# Patient Record
Sex: Female | Born: 1985 | Race: Black or African American | Hispanic: No | Marital: Single | Smoking: Light tobacco smoker
Health system: Southern US, Community
[De-identification: ages and names within clinical notes are randomized; demographics above are authoritative.]

## PROBLEM LIST (undated history)

## (undated) DIAGNOSIS — E079 Disorder of thyroid, unspecified: Secondary | ICD-10-CM

## (undated) HISTORY — PX: BREAST REDUCTION SURGERY: SHX8

---

## 2004-10-16 ENCOUNTER — Emergency Department (HOSPITAL_COMMUNITY): Admission: EM | Admit: 2004-10-16 | Discharge: 2004-10-16 | Payer: Self-pay | Admitting: Family Medicine

## 2005-09-25 ENCOUNTER — Emergency Department (HOSPITAL_COMMUNITY): Admission: EM | Admit: 2005-09-25 | Discharge: 2005-09-26 | Payer: Self-pay | Admitting: Emergency Medicine

## 2006-04-02 ENCOUNTER — Emergency Department (HOSPITAL_COMMUNITY): Admission: EM | Admit: 2006-04-02 | Discharge: 2006-04-02 | Payer: Self-pay | Admitting: Emergency Medicine

## 2006-04-23 ENCOUNTER — Emergency Department (HOSPITAL_COMMUNITY): Admission: EM | Admit: 2006-04-23 | Discharge: 2006-04-23 | Payer: Self-pay | Admitting: Emergency Medicine

## 2006-08-23 ENCOUNTER — Emergency Department (HOSPITAL_COMMUNITY): Admission: EM | Admit: 2006-08-23 | Discharge: 2006-08-23 | Payer: Self-pay | Admitting: Family Medicine

## 2006-10-17 ENCOUNTER — Emergency Department (HOSPITAL_COMMUNITY): Admission: EM | Admit: 2006-10-17 | Discharge: 2006-10-18 | Payer: Self-pay | Admitting: Emergency Medicine

## 2006-11-13 ENCOUNTER — Emergency Department (HOSPITAL_COMMUNITY): Admission: EM | Admit: 2006-11-13 | Discharge: 2006-11-13 | Payer: Self-pay | Admitting: Emergency Medicine

## 2007-01-14 ENCOUNTER — Emergency Department (HOSPITAL_COMMUNITY): Admission: EM | Admit: 2007-01-14 | Discharge: 2007-01-14 | Payer: Self-pay | Admitting: Emergency Medicine

## 2007-04-15 ENCOUNTER — Inpatient Hospital Stay (HOSPITAL_COMMUNITY): Admission: AD | Admit: 2007-04-15 | Discharge: 2007-04-15 | Payer: Self-pay | Admitting: Obstetrics and Gynecology

## 2008-11-20 ENCOUNTER — Other Ambulatory Visit (HOSPITAL_COMMUNITY): Admission: RE | Admit: 2008-11-20 | Discharge: 2009-02-18 | Payer: Self-pay | Admitting: Psychiatry

## 2008-11-22 ENCOUNTER — Ambulatory Visit: Payer: Self-pay | Admitting: Psychiatry

## 2008-12-09 ENCOUNTER — Ambulatory Visit (HOSPITAL_COMMUNITY): Payer: Self-pay | Admitting: Marriage and Family Therapist

## 2011-04-07 LAB — CBC
Hemoglobin: 13.4
Platelets: 236
RBC: 4.3
WBC: 12.8 — ABNORMAL HIGH

## 2011-04-07 LAB — URINALYSIS, ROUTINE W REFLEX MICROSCOPIC
Bilirubin Urine: NEGATIVE
Glucose, UA: NEGATIVE
Protein, ur: NEGATIVE
Specific Gravity, Urine: >1.03 — ABNORMAL HIGH
pH: 6

## 2011-04-12 LAB — POCT I-STAT CREATININE
Creatinine, Ser: 0.7
Operator id: 284251

## 2011-04-12 LAB — I-STAT 8, (EC8 V) (CONVERTED LAB)
Acid-base deficit: 3 — ABNORMAL HIGH
BUN: 10
Glucose, Bld: 108 — ABNORMAL HIGH
Hemoglobin: 12.9
Sodium: 137

## 2011-04-12 LAB — HCG, QUANTITATIVE, PREGNANCY: hCG, Beta Chain, Quant, S: 33033 — ABNORMAL HIGH

## 2011-04-12 LAB — ABO/RH: ABO/RH(D): O POS

## 2014-11-30 ENCOUNTER — Encounter (HOSPITAL_BASED_OUTPATIENT_CLINIC_OR_DEPARTMENT_OTHER): Payer: Self-pay | Admitting: *Deleted

## 2014-11-30 ENCOUNTER — Emergency Department (HOSPITAL_BASED_OUTPATIENT_CLINIC_OR_DEPARTMENT_OTHER)
Admission: EM | Admit: 2014-11-30 | Discharge: 2014-11-30 | Disposition: A | Discharge status: Home / Self Care | Payer: Medicaid Other | Attending: Emergency Medicine | Admitting: Emergency Medicine

## 2014-11-30 DIAGNOSIS — B86 Scabies: Secondary | ICD-10-CM | POA: Insufficient documentation

## 2014-11-30 DIAGNOSIS — E079 Disorder of thyroid, unspecified: Secondary | ICD-10-CM | POA: Insufficient documentation

## 2014-11-30 DIAGNOSIS — Z79899 Other long term (current) drug therapy: Secondary | ICD-10-CM | POA: Insufficient documentation

## 2014-11-30 DIAGNOSIS — R21 Rash and other nonspecific skin eruption: Secondary | ICD-10-CM | POA: Diagnosis present

## 2014-11-30 HISTORY — DX: Disorder of thyroid, unspecified: E07.9

## 2014-11-30 MED ORDER — PERMETHRIN 5 % EX CREA: TOPICAL_CREAM | CUTANEOUS | Status: DC

## 2014-11-30 NOTE — ED Notes (Signed)
Increasing itching rash all over body (daughter has same).  Itching worst between legs, back and in places that stay warm (bra line).

## 2014-11-30 NOTE — Discharge Instructions (Signed)

## 2014-11-30 NOTE — ED Provider Notes (Signed)
CSN: [id number]     Arrival date & time 11/30/14  1148 History   First MD Initiated Contact with Patient 11/30/14 1210     Chief Complaint  Patient presents with  . Rash     (Consider location/radiation/quality/duration/timing/severity/associated sxs/prior Treatment) HPI Comments: Patient presents to the ER for evaluation of itchy rash. She reports that the rash began after traveling to [place] and staying in a hotel. Rash has progressively worsened over the last month. Patient reports that the rash is extremely itchy. No tongue swelling, throat swelling or difficulty breathing. No new skin preparations, detergents, etc. Patient reports that her daughter has a rash.  Patient is a 29 y.o. female presenting with rash.  Rash   Past Medical History  Diagnosis Date  . Thyroid disease    History reviewed. No pertinent past surgical history. No family history on file. History  Substance Use Topics  . Smoking status: Never Smoker   . Smokeless tobacco: Not on file  . Alcohol Use: Not on file   OB History    No data available     Review of Systems  Skin: Positive for rash.  All other systems reviewed and are negative.     Allergies  Review of patient's allergies indicates no known allergies.  Home Medications   Prior to Admission medications   Medication Sig Start Date End Date Taking? Authorizing Provider  levothyroxine (SYNTHROID, LEVOTHROID) 75 MCG tablet Take 75 mcg by mouth daily before breakfast.   Yes Historical Provider, MD   BP 106/69 mmHg  Pulse 94  Temp(Src) 98.5 F (36.9 C) (Oral)  Ht 5\' 5"  (1.651 m)  Wt 200 lb (90.719 kg)  BMI 33.28 kg/m2  SpO2 98%  LMP 11/17/2014 Physical Exam  Constitutional: She is oriented to person, place, and time. She appears well-developed and well-nourished. No distress.  HENT:  Head: Normocephalic and atraumatic.  Right Ear: Hearing normal.  Left Ear: Hearing normal.  Nose: Nose normal.  Mouth/Throat: Oropharynx is clear  and moist and mucous membranes are normal.  Eyes: Conjunctivae and EOM are normal. Pupils are equal, round, and reactive to light.  Neck: Normal range of motion. Neck supple.  Cardiovascular: Regular rhythm, S1 normal and S2 normal.  Exam reveals no gallop and no friction rub.   No murmur heard. Pulmonary/Chest: Effort normal and breath sounds normal. No respiratory distress. She exhibits no tenderness.  Abdominal: Soft. Normal appearance and bowel sounds are normal. There is no hepatosplenomegaly. There is no tenderness. There is no rebound, no guarding, no tenderness at McBurney's point and negative Murphy's sign. No hernia.  Musculoskeletal: Normal range of motion.  Neurological: She is alert and oriented to person, place, and time. She has normal strength. No cranial nerve deficit or sensory deficit. Coordination normal. GCS eye subscore is 4. GCS verbal subscore is 5. GCS motor subscore is 6.  Skin: Skin is warm, dry and intact. No rash noted. No cyanosis.  Diffuse papular erythematous rash with constellations and intertriginous regions of torso and on hands  Psychiatric: She has a normal mood and affect. Her speech is normal and behavior is normal. Thought content normal.  Nursing note and vitals reviewed.   ED Course  Procedures (including critical care time) Labs Review Labs Reviewed - No data to display  Imaging Review No results found.   EKG Interpretation None      MDM   Final diagnoses:  None  scabies  Patient with rash that is consistent with scabies.  Gilda Crease, MD 11/30/14 [phone]

## 2014-12-03 ENCOUNTER — Encounter (HOSPITAL_BASED_OUTPATIENT_CLINIC_OR_DEPARTMENT_OTHER): Payer: Self-pay | Admitting: *Deleted

## 2014-12-03 ENCOUNTER — Emergency Department (HOSPITAL_BASED_OUTPATIENT_CLINIC_OR_DEPARTMENT_OTHER)
Admission: EM | Admit: 2014-12-03 | Discharge: 2014-12-03 | Disposition: A | Discharge status: Home / Self Care | Payer: Medicaid Other | Attending: Emergency Medicine | Admitting: Emergency Medicine

## 2014-12-03 DIAGNOSIS — B86 Scabies: Secondary | ICD-10-CM | POA: Insufficient documentation

## 2014-12-03 DIAGNOSIS — J029 Acute pharyngitis, unspecified: Secondary | ICD-10-CM | POA: Diagnosis not present

## 2014-12-03 DIAGNOSIS — E079 Disorder of thyroid, unspecified: Secondary | ICD-10-CM | POA: Diagnosis not present

## 2014-12-03 DIAGNOSIS — Z79899 Other long term (current) drug therapy: Secondary | ICD-10-CM | POA: Insufficient documentation

## 2014-12-03 DIAGNOSIS — R21 Rash and other nonspecific skin eruption: Secondary | ICD-10-CM | POA: Diagnosis present

## 2014-12-03 LAB — RAPID STREP SCREEN (MED CTR MEBANE ONLY): STREPTOCOCCUS, GROUP A SCREEN (DIRECT): NEGATIVE

## 2014-12-03 MED ORDER — MAGIC MOUTHWASH
10.0000 mL | Freq: Once | ORAL | Status: AC
  Administered 2014-12-03: 10 mL via ORAL
  Filled 2014-12-03: qty 10

## 2014-12-03 MED ORDER — IVERMECTIN 3 MG PO TABS: 200.0000 ug/kg | ORAL_TABLET | Freq: Once | ORAL | Status: DC

## 2014-12-03 MED ORDER — PREDNISONE 10 MG (21) PO TBPK: 10.0000 mg | ORAL_TABLET | Freq: Every day | ORAL | Status: DC

## 2014-12-03 MED ORDER — PERMETHRIN 5 % EX CREA: 1.0000 "application " | TOPICAL_CREAM | Freq: Once | CUTANEOUS | Status: DC

## 2014-12-03 MED ORDER — HYDROCORTISONE 2.5 % EX LOTN: TOPICAL_LOTION | Freq: Two times a day (BID) | CUTANEOUS | Status: DC

## 2014-12-03 NOTE — ED Notes (Signed)
Sore throat and rash. States she has scabies. Request stronger medication.

## 2014-12-03 NOTE — Discharge Instructions (Signed)
Scabies °Scabies are small bugs (mites) that burrow under the skin and cause red bumps and severe itching. These bugs can only be seen with a microscope. Scabies are highly contagious. They can spread easily from person to person by direct contact. They are also spread through sharing clothing or linens that have the scabies mites living in them. It is not unusual for an entire family to become infected through shared towels, clothing, or bedding.  °HOME CARE INSTRUCTIONS  °· Your caregiver may prescribe a cream or lotion to kill the mites. If cream is prescribed, massage the cream into the entire body from the neck to the bottom of both feet. Also massage the cream into the scalp and face if your child is less than 1 year old. Avoid the eyes and mouth. Do not wash your hands after application. °· Leave the cream on for 8 to 12 hours. Your child should bathe or shower after the 8 to 12 hour application period. Sometimes it is helpful to apply the cream to your child right before bedtime. °· One treatment is usually effective and will eliminate approximately 95% of infestations. For severe cases, your caregiver may decide to repeat the treatment in 1 week. Everyone in your household should be treated with one application of the cream. °· New rashes or burrows should not appear within 24 to 48 hours after successful treatment. However, the itching and rash may last for 2 to 4 weeks after successful treatment. Your caregiver may prescribe a medicine to help with the itching or to help the rash go away more quickly. °· Scabies can live on clothing or linens for up to 3 days. All of your child's recently used clothing, towels, stuffed toys, and bed linens should be washed in hot water and then dried in a dryer for at least 20 minutes on high heat. Items that cannot be washed should be enclosed in a plastic bag for at least 3 days. °· To help relieve itching, bathe your child in a cool bath or apply cool washcloths to the  affected areas. °· Your child may return to school after treatment with the prescribed cream. °SEEK MEDICAL CARE IF:  °· The itching persists longer than 4 weeks after treatment. °· The rash spreads or becomes infected. Signs of infection include red blisters or yellow-tan crust. °Document Released: 06/14/2005 Document Revised: 09/06/2011 Document Reviewed: 10/23/2008 °ExitCare® Patient Information ©2015 [employer]. This information is not intended to replace advice given to you by your health care provider. Make sure you discuss any questions you have with your health care provider. ° ° ° °Emergency Department Resource Guide °1) Find a Doctor and Pay Out of Pocket °Although you won't have to find out who is covered by your insurance plan, it is a good idea to ask around and get recommendations. You will then need to call the office and see if the doctor you have chosen will accept you as a new patient and what types of options they offer for patients who are self-pay. Some doctors offer discounts or will set up payment plans for their patients who do not have insurance, but you will need to ask so you aren't surprised when you get to your appointment. ° °2) Contact Your Local Health Department °Not all health departments have doctors that can see patients for sick visits, but many do, so it is worth a call to see if yours does. If you don't know where your local health department is, you   can check in your phone book. The CDC also has a tool to help you locate your state's health department, and many state websites also have listings of all of their local health departments. ° °3) Find a Walk-in Clinic °If your illness is not likely to be very severe or complicated, you may want to try a walk in clinic. These are popping up all over the country in pharmacies, drugstores, and shopping centers. They're usually staffed by nurse practitioners or physician assistants that have been trained to treat common illnesses  and complaints. They're usually fairly quick and inexpensive. However, if you have serious medical issues or chronic medical problems, these are probably not your best option. ° °No Primary Care Doctor: °- Call [facility] Connect at  832-8000 - they can help you locate a primary care doctor that  accepts your insurance, provides certain services, etc. °- Physician Referral Service- 1-800-533-3463 ° °Chronic Pain Problems: °Organization         Address  Phone   Notes  °[facility]  (336) 297-2271 Patients need to be referred by their primary care doctor.  ° °Medication Assistance: °Organization         Address  Phone   Notes  °[place] [facility] [address] °[place], [place] [address] (336) 641-8030 --Must be a resident of [place] °-- Must have NO insurance coverage whatsoever (no Medicaid/ Medicare, etc.) °-- The pt. MUST have a primary care doctor that directs their care regularly and follows them in the community °  °[facility]  (866) 331-1348   °[employer]  (888) 892-1162   ° °Agencies that provide inexpensive medical care: °Organization         Address  Phone   Notes  °[facility]  (336) 832-8035   °[facility]    (336) 832-7272   °[facility] [address] °[place], [place] [address] (336) 832-4777   °[facility] [address], °[place] (336) 271-4999   °[facility]    (336) 373-0678   °[facility]    (336) 272-1050   °[facility] ° [address], [place] Phone:  (336) 832-4444, Fax:  (336) 832-4440 Hours of Operation:  9 am - 6 pm, M-F.  Also accepts Medicaid/Medicare and self-pay.  °[facility] ° [address], [place] Phone: (336) 832-3150, Fax: (336) 832-3151. Hours of Operation:  8:30 am - 5:30 pm, M-F.  Also accepts Medicaid and self-pay.  °[facility] [place] [address], [place] Phone: (336) 878-6027   °[facility] [address], [place], [place] (336)723-1848, Ext. 123 Mondays & Thursdays: 7-9 AM.  First 15 patients are seen on a first come, first serve basis. °  ° °Medicaid-accepting [place] Providers: ° °Organization         Address  Phone   Notes  °[facility] [address], [place] (336) 641-2100 Also accepts self-pay patients.  °[facility] [address], [place] ° (336) 856-9996   °[facility] [address], [place] (336) 288-8857   °[facility] [address] [place] [address], [place] (336) 299-7000   °Veita Bland [address], [place]  ° (336) 373-1557 Only accepts [place] Access Medicaid patients after they have their name applied   to their card.  ° °Self-Pay (no insurance) in [place]: ° °Organization         Address  Phone   Notes  °Sickle Cell Patients, [facility] [address], [place] (336) 832-1970   °[facility] [address], [place] (336) 832-4400   ° [facility] ° [address], [place] (336) 992-4800   °[facility]/Dr. Osei-Bonsu ° [address] [place] [address], [place] or [address], [place] (336) 841-8500 Phone number for both [place] and [place] locations is the same.  °[facility] [address], [place] (336) 299-0000   °[facility] [address] [place] [address], [place] or [address] (336) 852-7530 °(336) 878-2260   °[facility] [address], [place] (336) 350-1642, phone; (336) 294-5005, fax Sees patients 1st and 3rd Saturday of every month.  Must not qualify for public or private insurance (i.e. Medicaid, Medicare, [place] Health Choice, Veterans' Benefits) • Household income should be no more than 200% of the poverty level  •The clinic cannot treat you if you are pregnant or think you are pregnant • Sexually transmitted diseases are not treated at the clinic.  ° ° °Dental Care: °Organization         Address  Phone  Notes  °[facility] [address], [place] (336) 641-6152 Accepts children up to age 21 who are enrolled in Medicaid or [place] Health Choice; pregnant women with a Medicaid card; and children who have applied for Medicaid or [employer] Health Choice, but were declined, whose parents can pay a reduced fee at time of service.  °[facility] [place]  [address], [place] (336) 641-7733 Accepts children up to age 21 who are enrolled in Medicaid or [place] Health Choice; pregnant women with a Medicaid card; and children who have applied for Medicaid or [place] Health Choice, but were declined, whose parents can pay a reduced fee at time of service.  °[facility] ° [address], [place] (336) 641-4533 Patients are seen by appointment only. Walk-ins are not accepted. [facility] will see patients 18 years of age and older. °Monday - Tuesday (8am-5pm) °Most Wednesdays (8:30-5pm) °$30 per visit, cash only  °[facility] ° [address], [place] (336) 641-4533 Patients are seen by appointment only. Walk-ins are not accepted. [facility] will see patients 18 years of age and older. °One Wednesday Evening (Monthly: Volunteer Based).  $30 per visit, cash only  °[facility]  (919) 537-3737 for adults; Children under age 4, call Graduate Pediatric Dentistry at ([phone]) [phone]. Children aged 4-14, please call ([phone]) [phone] to request a pediatric application. ° Dental services are provided in all areas of dental care including fillings, crowns and bridges, complete and partial dentures, implants, gum treatment, root canals, and extractions. Preventive care is  also provided. Treatment is provided to both adults and children. °Patients are selected via a lottery and there is often a waiting list. °  °[facility] [address], °[place] ° (336) 763-8833 [email or web address] °  °[facility] [address], [place], [place] (336)723-1848, Ext. 123 Second and Fourth Thursday of each month, opens at 6:30 AM; Clinic ends at 9 AM.  Patients are seen on a first-come first-served   basis, and a limited number are seen during each clinic.  ° °[facility] ° [address], [place], [place] (336) 723-7904   Eligibility Requirements °You must have lived in [place], [place], or [place] counties for at least the last three months. °  You cannot be eligible for state or federal sponsored healthcare insurance, including Veterans Administration, Medicaid, or Medicare. °  You generally cannot be eligible for healthcare insurance through your employer.  °  How to apply: °Eligibility screenings are held every Tuesday and Wednesday afternoon from 1:00 pm until 4:00 pm. You do not need an appointment for the interview!  °[facility] [address], [place], [place] 336-631-2330   °[facility]  336-342-8273   °[facility]  336-703-3100   °[facility]  336-570-6415   ° °Behavioral Health Resources in the Community: °Intensive Outpatient Programs °Organization         Address  Phone  Notes  °[facility] [address], [place], [place] 336-878-6098   °[facility] [address], [place], [place] 336-832-9800   °[facility] [address], [place], [place] ° 336-882-2125   °[facility] [address],  °[place], [place] 1-800-853-5163 or 336-641-4981   °Substance Abuse Resources °Organization         Address  Phone  Notes  °[facility]  336-882-2125   °[facility]  336-784-9470   °[facility]  336-285-9073   °[facility]  336-845-3988   °Residential & Outpatient Substance Abuse Program  1-800-659-3381   °Psychological Services °Organization         Address  Phone  Notes  °[facility]  336- 832-9600   °[facility]  336- 378-7881   °[facility] [address], [place] 1-800-853-5163 or 336-641-4981   ° °Mobile Crisis Teams °Organization         Address  Phone  Notes  °[facility], Mobile Crisis Care Unit  1-877-626-1772   °Assertive °[facility] ° [address] [place], [place] 336-834-9664   °Sharon DeEsch [address] °[place] 336-554-5454   ° °Self-Help/Support Groups °Organization         Address  Phone             Notes  °[facility] [place] - variety of support groups  336- 373-1402 Call for more information  °Narcotics Anonymous (NA), [facility] [address], °[place]  2 meetings at this location  ° °Residential Treatment Programs °Organization         Address  Phone  Notes  °[facility] [address],    °[place]  1-866-801-8205   °[facility] ° [address], [place], [place] 704-293-8524   °[facility] [address], [place] 336-845-3988 Admissions: 8am-3pm M-F  °[facility] [address]    °[place], [place] 336-841-1104   °[facility] [address], [place], [place] 336-379-7146   °[facility] [address]  °[place], [place] 336-285-9073   °[facility] - Intensive Outpatient [address], , [place] 336-852-3033   °[facility] [address]  °[place], [place] 1-877-615-2722 or 336-784-9470   °[facility] [address] [place],  336-227-7417 Accepts Medicaid  °  [facility] [address]  °[place] 1-800-659-3381  Substance Abuse/Addiction Treatment  ° °[place] Behavioral Health Resources °Organization         Address  Phone  Notes  °[facility]  (888) 581-9988   °Julie Brannon, PhD [address] [place], [place]   (336) 349-5553 or (336) 951-0000   °[facility]   [address] °[place], [place] (336) 349-4454   °[facility] [address], [place], [place] (336) 342-8316 Insurance/Medicaid/sponsorship through [facility]  °[facility] [address]                                    [place], [place] (336) 342-8316 Therapy/tele-psych/case  °[facility] [address]  ° [place],  (336) 349-2233    °Dr. Arfeen  (336) 349-4544   °[facility] [place]  [employer] [place] [facility] 1) [address], [place] °2) [address], [place] °3)  [address], [place] (336) 349-3220 °(336) 342-7768 ° °(336) 342-8140   °[place] [facility] (336) 342-1394 or (336) 342-3537 (After Hours)    ° ° ° °

## 2014-12-03 NOTE — ED Provider Notes (Signed)
CSN: [id number]     Arrival date & time 12/03/14  1850 History   None    Chief Complaint  Patient presents with  . Rash  . Sore Throat     (Consider location/radiation/quality/duration/timing/severity/associated sxs/prior Treatment) HPI Emma Ali is a 29 year old female who states that her and her daughter began having a rash after traveling to [place] and staying in a hotel. She said the rash has progressively worsened over the last month and that she has tried taking permethrin which was given to her on 11/30/2014 with minimal relief. She states she has also used hydrocortisone cream but this only helped temporarily. She states the rash is itchy. She denies any fever, tongue swelling, throat swelling, difficulty breathing, insect bites, new detergents or soaps. She states her daughter has the same rash and had it before she had it. Past Medical History  Diagnosis Date  . Thyroid disease    History reviewed. No pertinent past surgical history. No family history on file. History  Substance Use Topics  . Smoking status: Never Smoker   . Smokeless tobacco: Not on file  . Alcohol Use: Not on file   OB History    No data available     Review of Systems  Constitutional: Negative for fever and chills.  HENT: Positive for sore throat.   Respiratory: Negative for cough and shortness of breath.   Cardiovascular: Negative for chest pain.  Gastrointestinal: Negative for abdominal pain.  Skin: Positive for rash.  All other systems reviewed and are negative.     Allergies  Review of patient's allergies indicates no known allergies.  Home Medications   Prior to Admission medications   Medication Sig Start Date End Date Taking? Authorizing Provider  hydrocortisone 2.5 % lotion Apply topically 2 (two) times daily. 12/03/14   Emma Modesitt Patel-Mills, PA-C  ivermectin (STROMECTOL) 3 MG TABS tablet Take 6 tablets (18,000 mcg total) by mouth once. 12/03/14   Emma Harkless Patel-Mills, PA-C  levothyroxine  (SYNTHROID, LEVOTHROID) 75 MCG tablet Take 75 mcg by mouth daily before breakfast.    Historical Provider, MD  permethrin (ELIMITE) 5 % cream Apply 1 application topically once. 12/03/14   Emma Byard Patel-Mills, PA-C  predniSONE (STERAPRED UNI-PAK 21 TAB) 10 MG (21) TBPK tablet Take 1 tablet (10 mg total) by mouth daily. Take 6 tabs by mouth daily  for 2 days, then 5 tabs for 2 days, then 4 tabs for 2 days, then 3 tabs for 2 days, 2 tabs for 2 days, then 1 tab by mouth daily for 2 days 12/03/14   Emma Formosa Patel-Mills, PA-C   BP 114/61 mmHg  Pulse 79  Temp(Src) 98.2 F (36.8 C) (Oral)  Resp 16  Ht  (1.651 m)  Wt 200 lb (90.719 kg)  BMI 33.28 kg/m2  SpO2 98%  LMP 11/17/2014 Physical Exam  Constitutional: She is oriented to person, place, and time. She appears well-developed and well-nourished.  HENT:  Head: Normocephalic and atraumatic.  Mouth/Throat: Uvula is midline, oropharynx is clear and moist and mucous membranes are normal. No trismus in the jaw. No uvula swelling. No oropharyngeal exudate, posterior oropharyngeal edema, posterior oropharyngeal erythema or tonsillar abscesses.  Eyes: Conjunctivae are normal.  Neck: Normal range of motion.  Cardiovascular: Normal rate, regular rhythm and normal heart sounds.   Pulmonary/Chest: Effort normal and breath sounds normal.  Musculoskeletal: Normal range of motion.  Neurological: She is alert and oriented to person, place, and time.  Skin: Skin is warm and dry. Rash noted.  Diffuse papular, erythematous rash in the intertriginous regions including the axilla, groin, under the breasts.   Nursing note and vitals reviewed.   ED Course  Procedures (including critical care time) Labs Review Labs Reviewed  RAPID STREP SCREEN (NOT AT [facility])  CULTURE, GROUP A STREP    Imaging Review No results found.   EKG Interpretation None      MDM   Final diagnoses:  Scabies  Patient presents for rash that she has had for over one month. She  states she has used hydrocortisone cream and permethrin 5% with minimal relief. She states that she is still itching. I discussed that she would continue itching and would still need to take her second dose of permethrin 5% on Saturday. She states Medicaid would not cover her daughter and that she used her second dose for the daughter. I have written her another dose of permethrin 5%. I have also given her Ivermectin, steroids, and hydrocortisone cream. She is also complaining of sore throat. She denies any fever, cough. Her strep was negative. I reviewed this [facility] criteria and I believe this is viral.    Catha GosselinHanna Patel-Mills, PA-C 12/03/14 2238  Gwyneth SproutWhitney Plunkett, MD 12/05/14 [id number]

## 2014-12-05 LAB — CULTURE, GROUP A STREP: Strep A Culture: NEGATIVE

## 2015-01-19 ENCOUNTER — Encounter (HOSPITAL_BASED_OUTPATIENT_CLINIC_OR_DEPARTMENT_OTHER): Payer: Self-pay

## 2015-01-19 ENCOUNTER — Emergency Department (HOSPITAL_BASED_OUTPATIENT_CLINIC_OR_DEPARTMENT_OTHER)
Admission: EM | Admit: 2015-01-19 | Discharge: 2015-01-19 | Disposition: A | Discharge status: Home / Self Care | Payer: Medicaid Other | Attending: Emergency Medicine | Admitting: Emergency Medicine

## 2015-01-19 DIAGNOSIS — Z7952 Long term (current) use of systemic steroids: Secondary | ICD-10-CM | POA: Insufficient documentation

## 2015-01-19 DIAGNOSIS — R102 Pelvic and perineal pain: Secondary | ICD-10-CM

## 2015-01-19 DIAGNOSIS — Z720 Tobacco use: Secondary | ICD-10-CM | POA: Diagnosis not present

## 2015-01-19 DIAGNOSIS — Z79899 Other long term (current) drug therapy: Secondary | ICD-10-CM | POA: Diagnosis not present

## 2015-01-19 DIAGNOSIS — E079 Disorder of thyroid, unspecified: Secondary | ICD-10-CM | POA: Insufficient documentation

## 2015-01-19 DIAGNOSIS — N760 Acute vaginitis: Secondary | ICD-10-CM | POA: Diagnosis not present

## 2015-01-19 DIAGNOSIS — Z3202 Encounter for pregnancy test, result negative: Secondary | ICD-10-CM | POA: Insufficient documentation

## 2015-01-19 DIAGNOSIS — R197 Diarrhea, unspecified: Secondary | ICD-10-CM | POA: Diagnosis not present

## 2015-01-19 DIAGNOSIS — B9689 Other specified bacterial agents as the cause of diseases classified elsewhere: Secondary | ICD-10-CM

## 2015-01-19 LAB — URINALYSIS, ROUTINE W REFLEX MICROSCOPIC
BILIRUBIN URINE: NEGATIVE
Glucose, UA: NEGATIVE mg/dL
HGB URINE DIPSTICK: NEGATIVE
Ketones, ur: NEGATIVE mg/dL
LEUKOCYTES UA: NEGATIVE
NITRITE: NEGATIVE
PROTEIN: NEGATIVE mg/dL
Specific Gravity, Urine: 1.021 (ref 1.005–1.030)
Urobilinogen, UA: 1 mg/dL (ref 0.0–1.0)
pH: 7.5 (ref 5.0–8.0)

## 2015-01-19 LAB — WET PREP, GENITAL: Trich, Wet Prep: NONE SEEN

## 2015-01-19 LAB — PREGNANCY, URINE: PREG TEST UR: NEGATIVE

## 2015-01-19 MED ORDER — METRONIDAZOLE 500 MG PO TABS: 500.0000 mg | ORAL_TABLET | Freq: Two times a day (BID) | ORAL | Status: DC

## 2015-01-19 NOTE — ED Provider Notes (Signed)
CSN: [id number]     Arrival date & time 01/19/15  1813 History  This chart was scribed for Jerelyn Scott, MD by Abel Presto, ED Scribe. This patient was seen in room MH10/MH10 and the patient's care was started at 8:29 PM.      Chief Complaint  Patient presents with  . Pelvic Pain     The history is provided by the patient. No language interpreter was used.   HPI Comments: Emma Ali is a 29 y.o. female with PMHx of thyroid disease who presents to the Emergency Department complaining of cramping pelvic pain with onset 2 days ago. Pt's LNMP started 2 days ago. She placed a tampon in at that time. Pt states she has not had any bleeding since that day. She is concerned the tampon remains in her vagina. She also reports some diarrhea. She denies fever, chills, and vomiting.   Past Medical History  Diagnosis Date  . Thyroid disease    History reviewed. No pertinent past surgical history. History reviewed. No pertinent family history. History  Substance Use Topics  . Smoking status: Light Tobacco Smoker  . Smokeless tobacco: Not on file  . Alcohol Use: Yes     Comment: occasional   OB History    No data available     Review of Systems  Constitutional: Negative for fever and chills.  Gastrointestinal: Positive for diarrhea. Negative for vomiting.  Genitourinary: Positive for pelvic pain.  All other systems reviewed and are negative.     Allergies  Review of patient's allergies indicates no known allergies.  Home Medications   Prior to Admission medications   Medication Sig Start Date End Date Taking? Authorizing Provider  levothyroxine (SYNTHROID, LEVOTHROID) 75 MCG tablet Take 75 mcg by mouth daily before breakfast.   Yes Historical Provider, MD  hydrocortisone 2.5 % lotion Apply topically 2 (two) times daily. 12/03/14   Hanna Patel-Mills, PA-C  ivermectin (STROMECTOL) 3 MG TABS tablet Take 6 tablets (18,000 mcg total) by mouth once. 12/03/14   Hanna Patel-Mills, PA-C   levothyroxine (SYNTHROID, LEVOTHROID) 75 MCG tablet Take 75 mcg by mouth daily before breakfast.    Historical Provider, MD  metroNIDAZOLE (FLAGYL) 500 MG tablet Take 1 tablet (500 mg total) by mouth 2 (two) times daily. 01/19/15   Jerelyn Scott, MD  permethrin (ELIMITE) 5 % cream Apply 1 application topically once. 12/03/14   Hanna Patel-Mills, PA-C  predniSONE (STERAPRED UNI-PAK 21 TAB) 10 MG (21) TBPK tablet Take 1 tablet (10 mg total) by mouth daily. Take 6 tabs by mouth daily  for 2 days, then 5 tabs for 2 days, then 4 tabs for 2 days, then 3 tabs for 2 days, 2 tabs for 2 days, then 1 tab by mouth daily for 2 days 12/03/14   Lorelle Formosa Patel-Mills, PA-C   BP 122/73 mmHg  Pulse 87  Temp(Src) 97.5 F (36.4 C) (Oral)  Resp 18  Ht 5\' 5"  (1.651 m)  Wt 200 lb (90.719 kg)  BMI 33.28 kg/m2  SpO2 98%  LMP 12/09/2014 Physical Exam  Constitutional: She is oriented to person, place, and time. She appears well-developed and well-nourished.  HENT:  Head: Normocephalic.  Eyes: Conjunctivae are normal.  Neck: Normal range of motion. Neck supple.  Pulmonary/Chest: Effort normal.  Musculoskeletal: Normal range of motion.  Neurological: She is alert and oriented to person, place, and time.  Skin: Skin is warm and dry.  Psychiatric: She has a normal mood and affect. Her behavior is normal.  Nursing  note and vitals reviewed. gen- awake, alert, NAD, lungs CTAB, CV- RRR no murmur, abdomen- soft, nontender, nondistended, pelvis- no CMT, no adnexal tenderness, normal external female genitialia, no foreign body/retained tampon seen or palpated on bimanual exam.   ED Course  Procedures (including critical care time) DIAGNOSTIC STUDIES: Oxygen Saturation is 100% on room air, normal by my interpretation.    COORDINATION OF CARE: 8:32 PM Discussed treatment plan with patient at beside, the patient agrees with the plan and has no further questions at this time.   Labs Review Labs Reviewed  WET PREP, GENITAL -  Abnormal; Notable for the following:    Yeast Wet Prep HPF POC FEW (*)    Clue Cells Wet Prep HPF POC MODERATE (*)    WBC, Wet Prep HPF POC MODERATE (*)    All other components within normal limits  PREGNANCY, URINE  URINALYSIS, ROUTINE W REFLEX MICROSCOPIC (NOT AT [facility])  GC/CHLAMYDIA PROBE AMP ([place]) NOT AT [facility]    Imaging Review No results found.   EKG Interpretation None      MDM   Final diagnoses:  Pelvic cramping  Bacterial vaginosis   Pt presenting with c/o pelvic pain and cramping, concern for retained tampon.  Also concerned that her menstrual cycle lasted only one day.  Pelvic exam is reassuring- no foreign body found.  ua and upreg are negative.  Mellody Drown prep shows moderate clue cells so will treat with flagyl for bacterial vaginosis.  Discharged with strict return precautions.  Pt agreeable with plan.  I personally performed the services described in this documentation, which was scribed in my presence. The recorded information has been reviewed and is accurate.     Jerelyn Scott, MD 01/21/15 1355

## 2015-01-19 NOTE — ED Notes (Signed)
Pt reports pelvic and vaginal pain since Friday. Reports abnormal menses Friday. States she thinks her tampon may be lodged in her vaginal vault but she is unsure.

## 2015-01-19 NOTE — Discharge Instructions (Signed)
Return to the ED with any concerns including worsening abdominal pain, vomiting and not able to keep down liquids or medications, decreased level of alertness/lethargy, or any other alarming symptoms

## 2015-01-19 NOTE — ED Notes (Addendum)
Patient refused HIV blood draw.  States she had a full physical 1 month ago and does not want this done.  Patient states "I just want to make sure I'm not pregnant and that I don't have a tampon stuck in me".  MD made aware

## 2015-01-19 NOTE — ED Notes (Signed)
MD at bedside. 

## 2015-01-21 LAB — GC/CHLAMYDIA PROBE AMP (~~LOC~~) NOT AT ARMC
Chlamydia: NEGATIVE
NEISSERIA GONORRHEA: NEGATIVE

## 2015-05-15 ENCOUNTER — Encounter (HOSPITAL_BASED_OUTPATIENT_CLINIC_OR_DEPARTMENT_OTHER): Payer: Self-pay

## 2015-05-15 ENCOUNTER — Emergency Department (HOSPITAL_BASED_OUTPATIENT_CLINIC_OR_DEPARTMENT_OTHER)
Admission: EM | Admit: 2015-05-15 | Discharge: 2015-05-15 | Disposition: A | Discharge status: Home / Self Care | Payer: Medicaid Other | Attending: Emergency Medicine | Admitting: Emergency Medicine

## 2015-05-15 DIAGNOSIS — Z79899 Other long term (current) drug therapy: Secondary | ICD-10-CM | POA: Diagnosis not present

## 2015-05-15 DIAGNOSIS — F17200 Nicotine dependence, unspecified, uncomplicated: Secondary | ICD-10-CM | POA: Diagnosis not present

## 2015-05-15 DIAGNOSIS — E079 Disorder of thyroid, unspecified: Secondary | ICD-10-CM | POA: Insufficient documentation

## 2015-05-15 DIAGNOSIS — R110 Nausea: Secondary | ICD-10-CM | POA: Diagnosis present

## 2015-05-15 DIAGNOSIS — R197 Diarrhea, unspecified: Secondary | ICD-10-CM | POA: Insufficient documentation

## 2015-05-15 DIAGNOSIS — Z3490 Encounter for supervision of normal pregnancy, unspecified, unspecified trimester: Secondary | ICD-10-CM

## 2015-05-15 DIAGNOSIS — Z792 Long term (current) use of antibiotics: Secondary | ICD-10-CM | POA: Diagnosis not present

## 2015-05-15 DIAGNOSIS — Z331 Pregnant state, incidental: Secondary | ICD-10-CM | POA: Insufficient documentation

## 2015-05-15 LAB — PREGNANCY, URINE: Preg Test, Ur: POSITIVE — AB

## 2015-05-15 MED ORDER — ONDANSETRON 4 MG PO TBDP
ORAL_TABLET | ORAL | Status: AC
  Filled 2015-05-15: qty 1

## 2015-05-15 MED ORDER — ONDANSETRON 4 MG PO TBDP
4.0000 mg | ORAL_TABLET | Freq: Once | ORAL | Status: AC
  Administered 2015-05-15: 4 mg via ORAL

## 2015-05-15 MED ORDER — ONDANSETRON HCL 4 MG PO TABS: 4.0000 mg | ORAL_TABLET | Freq: Four times a day (QID) | ORAL | Status: AC

## 2015-05-15 MED ORDER — ONDANSETRON HCL 8 MG PO TABS: 4.0000 mg | ORAL_TABLET | Freq: Once | ORAL | Status: DC

## 2015-05-15 NOTE — Discharge Instructions (Signed)
First Trimester of Pregnancy The first trimester of pregnancy is from week 1 until the end of week 12 (months 1 through 3). A week after a sperm fertilizes an egg, the egg will implant on the wall of the uterus. This embryo will begin to develop into a baby. Genes from you and your partner are forming the baby. The female genes determine whether the baby is a boy or a girl. At 6-8 weeks, the eyes and face are formed, and the heartbeat can be seen on ultrasound. At the end of 12 weeks, all the baby's organs are formed.  Now that you are pregnant, you will want to do everything you can to have a healthy baby. Two of the most important things are to get good prenatal care and to follow your health care provider's instructions. Prenatal care is all the medical care you receive before the baby's birth. This care will help prevent, find, and treat any problems during the pregnancy and childbirth. BODY CHANGES Your body goes through many changes during pregnancy. The changes vary from woman to woman.   You may gain or lose a couple of pounds at first.  You may feel sick to your stomach (nauseous) and throw up (vomit). If the vomiting is uncontrollable, call your health care provider.  You may tire easily.  You may develop headaches that can be relieved by medicines approved by your health care provider.  You may urinate more often. Painful urination may mean you have a bladder infection.  You may develop heartburn as a result of your pregnancy.  You may develop constipation because certain hormones are causing the muscles that push waste through your intestines to slow down.  You may develop hemorrhoids or swollen, bulging veins (varicose veins).  Your breasts may begin to grow larger and become tender. Your nipples may stick out more, and the tissue that surrounds them (areola) may become darker.  Your gums may bleed and may be sensitive to brushing and flossing.  Dark spots or blotches (chloasma,  mask of pregnancy) may develop on your face. This will likely fade after the baby is born.  Your menstrual periods will stop.  You may have a loss of appetite.  You may develop cravings for certain kinds of food.  You may have changes in your emotions from day to day, such as being excited to be pregnant or being concerned that something may go wrong with the pregnancy and baby.  You may have more vivid and strange dreams.  You may have changes in your hair. These can include thickening of your hair, rapid growth, and changes in texture. Some women also have hair loss during or after pregnancy, or hair that feels dry or thin. Your hair will most likely return to normal after your baby is born. WHAT TO EXPECT AT YOUR PRENATAL VISITS During a routine prenatal visit:  You will be weighed to make sure you and the baby are growing normally.  Your blood pressure will be taken.  Your abdomen will be measured to track your baby's growth.  The fetal heartbeat will be listened to starting around week 10 or 12 of your pregnancy.  Test results from any previous visits will be discussed. Your health care provider may ask you:  How you are feeling.  If you are feeling the baby move.  If you have had any abnormal symptoms, such as leaking fluid, bleeding, severe headaches, or abdominal cramping.  If you are using any tobacco products,   including cigarettes, chewing tobacco, and electronic cigarettes.  If you have any questions. Other tests that may be performed during your first trimester include:  Blood tests to find your blood type and to check for the presence of any previous infections. They will also be used to check for low iron levels (anemia) and Rh antibodies. Later in the pregnancy, blood tests for diabetes will be done along with other tests if problems develop.  Urine tests to check for infections, diabetes, or protein in the urine.  An ultrasound to confirm the proper growth  and development of the baby.  An amniocentesis to check for possible genetic problems.  Fetal screens for spina bifida and Down syndrome.  You may need other tests to make sure you and the baby are doing well.  HIV (human immunodeficiency virus) testing. Routine prenatal testing includes screening for HIV, unless you choose not to have this test. HOME CARE INSTRUCTIONS  Medicines  Follow your health care provider's instructions regarding medicine use. Specific medicines may be either safe or unsafe to take during pregnancy.  Take your prenatal vitamins as directed.  If you develop constipation, try taking a stool softener if your health care provider approves. Diet  Eat regular, well-balanced meals. Choose a variety of foods, such as meat or vegetable-based protein, fish, milk and low-fat dairy products, vegetables, fruits, and whole grain breads and cereals. Your health care provider will help you determine the amount of weight gain that is right for you.  Avoid raw meat and uncooked cheese. These carry germs that can cause birth defects in the baby.  Eating four or five small meals rather than three large meals a day may help relieve nausea and vomiting. If you start to feel nauseous, eating a few soda crackers can be helpful. Drinking liquids between meals instead of during meals also seems to help nausea and vomiting.  If you develop constipation, eat more high-fiber foods, such as fresh vegetables or fruit and whole grains. Drink enough fluids to keep your urine clear or pale yellow. Activity and Exercise  Exercise only as directed by your health care provider. Exercising will help you:  Control your weight.  Stay in shape.  Be prepared for labor and delivery.  Experiencing pain or cramping in the lower abdomen or low back is a good sign that you should stop exercising. Check with your health care provider before continuing normal exercises.  Try to avoid standing for long  periods of time. Move your legs often if you must stand in one place for a long time.  Avoid heavy lifting.  Wear low-heeled shoes, and practice good posture.  You may continue to have sex unless your health care provider directs you otherwise. Relief of Pain or Discomfort  Wear a good support bra for breast tenderness.   Take warm sitz baths to soothe any pain or discomfort caused by hemorrhoids. Use hemorrhoid cream if your health care provider approves.   Rest with your legs elevated if you have leg cramps or low back pain.  If you develop varicose veins in your legs, wear support hose. Elevate your feet for 15 minutes, 3-4 times a day. Limit salt in your diet. Prenatal Care  Schedule your prenatal visits by the twelfth week of pregnancy. They are usually scheduled monthly at first, then more often in the last 2 months before delivery.  Write down your questions. Take them to your prenatal visits.  Keep all your prenatal visits as directed by your   health care provider. Safety  Wear your seat belt at all times when driving.  Make a list of emergency phone numbers, including numbers for family, friends, the hospital, and police and fire departments. General Tips  Ask your health care provider for a referral to a local prenatal education class. Begin classes no later than at the beginning of month 6 of your pregnancy.  Ask for help if you have counseling or nutritional needs during pregnancy. Your health care provider can offer advice or refer you to specialists for help with various needs.  Do not use hot tubs, steam rooms, or saunas.  Do not douche or use tampons or scented sanitary pads.  Do not cross your legs for long periods of time.  Avoid cat litter boxes and soil used by cats. These carry germs that can cause birth defects in the baby and possibly loss of the fetus by miscarriage or stillbirth.  Avoid all smoking, herbs, alcohol, and medicines not prescribed by  your health care provider. Chemicals in these affect the formation and growth of the baby.  Do not use any tobacco products, including cigarettes, chewing tobacco, and electronic cigarettes. If you need help quitting, ask your health care provider. You may receive counseling support and other resources to help you quit.  Schedule a dentist appointment. At home, brush your teeth with a soft toothbrush and be gentle when you floss. SEEK MEDICAL CARE IF:   You have dizziness.  You have mild pelvic cramps, pelvic pressure, or nagging pain in the abdominal area.  You have persistent nausea, vomiting, or diarrhea.  You have a bad smelling vaginal discharge.  You have pain with urination.  You notice increased swelling in your face, hands, legs, or ankles. SEEK IMMEDIATE MEDICAL CARE IF:   You have a fever.  You are leaking fluid from your vagina.  You have spotting or bleeding from your vagina.  You have severe abdominal cramping or pain.  You have rapid weight gain or loss.  You vomit blood or material that looks like coffee grounds.  You are exposed to German measles and have never had them.  You are exposed to fifth disease or chickenpox.  You develop a severe headache.  You have shortness of breath.  You have any kind of trauma, such as from a fall or a car accident.   This information is not intended to replace advice given to you by your health care provider. Make sure you discuss any questions you have with your health care provider.   Document Released: 06/08/2001 Document Revised: 07/05/2014 Document Reviewed: 04/24/2013 Elsevier Interactive Patient Education 2016 [employer]  

## 2015-05-15 NOTE — ED Provider Notes (Signed)
CSN: [id number]     Arrival date & time 05/15/15  1538 History   First MD Initiated Contact with Patient 05/15/15 1559     Chief Complaint  Patient presents with  . Nausea    (Consider location/radiation/quality/duration/timing/severity/associated sxs/prior Treatment) The history is provided by the patient. No language interpreter was used.  Ms. Henken is a 29 y.o female G2 P1 A0 with a history of thyroid disease who presents with nausea x 1 week and worse in the morning. She also reports having some cramping abdominal pain that resolved with 2 episodes of diarrhea yesterday. She had 1 prior C-section. She is not on birth control. She denies any fever, chills, vomiting, constipation, dysuria, hematuria, urinary frequency, vaginal bleeding or discharge. Her last menstrual period was at the beginning of October but does not know exact date.  Past Medical History  Diagnosis Date  . Thyroid disease    History reviewed. No pertinent past surgical history. History reviewed. No pertinent family history. Social History  Substance Use Topics  . Smoking status: Light Tobacco Smoker  . Smokeless tobacco: None  . Alcohol Use: Yes     Comment: occasional   OB History    No data available     Review of Systems  Constitutional: Negative for fever.  Gastrointestinal: Positive for nausea and diarrhea. Negative for vomiting.  Genitourinary: Negative for dysuria, frequency and hematuria.      Allergies  Review of patient's allergies indicates no known allergies.  Home Medications   Prior to Admission medications   Medication Sig Start Date End Date Taking? Authorizing Provider  levothyroxine (SYNTHROID, LEVOTHROID) 75 MCG tablet Take 75 mcg by mouth daily before breakfast.   Yes Historical Provider, MD  hydrocortisone 2.5 % lotion Apply topically 2 (two) times daily. 12/03/14   Thaniel Coluccio Patel-Mills, PA-C  ivermectin (STROMECTOL) 3 MG TABS tablet Take 6 tablets (18,000 mcg total) by mouth once.  12/03/14   Emilo Gras Patel-Mills, PA-C  levothyroxine (SYNTHROID, LEVOTHROID) 75 MCG tablet Take 75 mcg by mouth daily before breakfast.    Historical Provider, MD  metroNIDAZOLE (FLAGYL) 500 MG tablet Take 1 tablet (500 mg total) by mouth 2 (two) times daily. 01/19/15   Jerelyn Scott, MD  ondansetron (ZOFRAN) 4 MG tablet Take 1 tablet (4 mg total) by mouth every 6 (six) hours. 05/15/15   Kree Rafter Patel-Mills, PA-C  permethrin (ELIMITE) 5 % cream Apply 1 application topically once. 12/03/14   Breann Losano Patel-Mills, PA-C  predniSONE (STERAPRED UNI-PAK 21 TAB) 10 MG (21) TBPK tablet Take 1 tablet (10 mg total) by mouth daily. Take 6 tabs by mouth daily  for 2 days, then 5 tabs for 2 days, then 4 tabs for 2 days, then 3 tabs for 2 days, 2 tabs for 2 days, then 1 tab by mouth daily for 2 days 12/03/14   Lorelle Formosa Patel-Mills, PA-C   BP 138/65 mmHg  Pulse 86  Temp(Src) 98.8 F (37.1 C) (Oral)  Resp 16  Ht  (1.626 m)  Wt 212 lb (96.163 kg)  BMI 36.37 kg/m2  SpO2 100%  LMP 03/31/2015 Physical Exam  Constitutional: She is oriented to person, place, and time. She appears well-developed and well-nourished.  HENT:  Head: Normocephalic and atraumatic.  Eyes: Conjunctivae are normal.  Neck: Normal range of motion. Neck supple.  Cardiovascular: Normal rate.   Pulmonary/Chest: Effort normal.  Abdominal: Soft. She exhibits no distension. There is no tenderness. There is no rebound and no guarding.  Obese. No abdominal tenderness to palpation.  No guarding or rebound.  Musculoskeletal: Normal range of motion.  Neurological: She is alert and oriented to person, place, and time.  Skin: Skin is warm and dry.  Psychiatric: She has a normal mood and affect.  Nursing note and vitals reviewed.   ED Course  Procedures (including critical care time) Labs Review Labs Reviewed  PREGNANCY, URINE - Abnormal; Notable for the following:    Preg Test, Ur POSITIVE (*)    All other components within normal limits    Imaging  Review No results found. I have personally reviewed and evaluated these lab results as part of my medical decision-making.   EKG Interpretation None      MDM   Final diagnoses:  Pregnant   Patient presents for nausea and 2 episodes of diarrhea for the past week. She denies any abdominal pain, vaginal bleeding, discharge, dysuria, hematuria, or urinary frequency. She has a positive pregnancy test in the ED. I do not believe she needs further workup in the emergency Department and can follow-up with her preferred OB/GYN as discussed. She states she has no OB/GYN that she has seen previously when she was pregnant with her daughter. Return precautions were discussed with the patient and she was given a prescription for Zofran. I also discussed taking prenatal vitamins. Patient verbalizes understanding of the plan. Medications  ondansetron (ZOFRAN-ODT) disintegrating tablet 4 mg (4 mg Oral Given 05/15/15 1632)      Catha GosselinHanna Patel-Mills, PA-C 05/15/15 1919  Lyndal Pulleyaniel Knott, MD 05/15/15 1925

## 2015-05-15 NOTE — ED Notes (Signed)
PT reports nausea x1 week, worse in the morning, had 2 episodes of diarrhea yesterday with abdominal cramping, causing her to feel concerned.

## 2015-05-15 NOTE — ED Notes (Signed)
Pt. Was seen by EDP and assessed accordingly.  Pt. Discharged after being cared for in ED.

## 2015-07-16 ENCOUNTER — Encounter (HOSPITAL_BASED_OUTPATIENT_CLINIC_OR_DEPARTMENT_OTHER): Payer: Self-pay

## 2015-07-16 ENCOUNTER — Emergency Department (HOSPITAL_BASED_OUTPATIENT_CLINIC_OR_DEPARTMENT_OTHER)
Admission: EM | Admit: 2015-07-16 | Discharge: 2015-07-16 | Disposition: A | Discharge status: Home / Self Care | Payer: Commercial Managed Care - HMO | Attending: Emergency Medicine | Admitting: Emergency Medicine

## 2015-07-16 DIAGNOSIS — R1030 Lower abdominal pain, unspecified: Secondary | ICD-10-CM | POA: Insufficient documentation

## 2015-07-16 DIAGNOSIS — Z3202 Encounter for pregnancy test, result negative: Secondary | ICD-10-CM | POA: Diagnosis not present

## 2015-07-16 DIAGNOSIS — F17210 Nicotine dependence, cigarettes, uncomplicated: Secondary | ICD-10-CM | POA: Diagnosis not present

## 2015-07-16 DIAGNOSIS — Z792 Long term (current) use of antibiotics: Secondary | ICD-10-CM | POA: Insufficient documentation

## 2015-07-16 DIAGNOSIS — Z7952 Long term (current) use of systemic steroids: Secondary | ICD-10-CM | POA: Diagnosis not present

## 2015-07-16 DIAGNOSIS — E079 Disorder of thyroid, unspecified: Secondary | ICD-10-CM | POA: Insufficient documentation

## 2015-07-16 DIAGNOSIS — R102 Pelvic and perineal pain: Secondary | ICD-10-CM | POA: Diagnosis present

## 2015-07-16 DIAGNOSIS — Z79899 Other long term (current) drug therapy: Secondary | ICD-10-CM | POA: Insufficient documentation

## 2015-07-16 LAB — URINALYSIS, ROUTINE W REFLEX MICROSCOPIC
Bilirubin Urine: NEGATIVE
GLUCOSE, UA: NEGATIVE mg/dL
Hgb urine dipstick: NEGATIVE
Ketones, ur: NEGATIVE mg/dL
Leukocytes, UA: NEGATIVE
Nitrite: NEGATIVE
Protein, ur: NEGATIVE mg/dL
Specific Gravity, Urine: 1.027 (ref 1.005–1.030)
pH: 7 (ref 5.0–8.0)

## 2015-07-16 LAB — PREGNANCY, URINE: PREG TEST UR: NEGATIVE

## 2015-07-16 NOTE — ED Provider Notes (Signed)
CSN: [id number]     Arrival date & time 07/16/15  1938 History   First MD Initiated Contact with Patient 07/16/15 1948     Chief Complaint  Patient presents with  . Pelvic Pain     HPI   Emma Ali is an 30 y.o. female with no significant PMH who presents for pregnancy test. She states she has a "weird" intermittent pain in her lower abdomen for the past two days. She states it is mild and comes and goes. She denies associated nausea, vomiting, diarrhea. Denies radiation of the pain. Denies dysuria, urinary frequency/urinary urgency. Denies vaginal pain, bleeding, or discharge. Denies fever, chills, chest pain, SOB. She states she is here because she wants a pregnancy test as she is worried she is pregnant. She states she had a miscarriage in 04/2015 and has had irregular periods since then. She does not want to do a pelvic exam today  Past Medical History  Diagnosis Date  . Thyroid disease    History reviewed. No pertinent past surgical history. No family history on file. Social History  Substance Use Topics  . Smoking status: Light Tobacco Smoker  . Smokeless tobacco: None  . Alcohol Use: Yes     Comment: occasional   OB History    No data available     Review of Systems  All other systems reviewed and are negative.     Allergies  Review of patient's allergies indicates no known allergies.  Home Medications   Prior to Admission medications   Medication Sig Start Date End Date Taking? Authorizing Provider  levothyroxine (SYNTHROID, LEVOTHROID) 75 MCG tablet Take 75 mcg by mouth daily before breakfast.   Yes Historical Provider, MD  hydrocortisone 2.5 % lotion Apply topically 2 (two) times daily. 12/03/14   Hanna Patel-Mills, PA-C  ivermectin (STROMECTOL) 3 MG TABS tablet Take 6 tablets (18,000 mcg total) by mouth once. 12/03/14   Hanna Patel-Mills, PA-C  levothyroxine (SYNTHROID, LEVOTHROID) 75 MCG tablet Take 75 mcg by mouth daily before breakfast.    Historical Provider, MD   metroNIDAZOLE (FLAGYL) 500 MG tablet Take 1 tablet (500 mg total) by mouth 2 (two) times daily. 01/19/15   Jerelyn Scott, MD  ondansetron (ZOFRAN) 4 MG tablet Take 1 tablet (4 mg total) by mouth every 6 (six) hours. 05/15/15   Hanna Patel-Mills, PA-C  permethrin (ELIMITE) 5 % cream Apply 1 application topically once. 12/03/14   Hanna Patel-Mills, PA-C  predniSONE (STERAPRED UNI-PAK 21 TAB) 10 MG (21) TBPK tablet Take 1 tablet (10 mg total) by mouth daily. Take 6 tabs by mouth daily  for 2 days, then 5 tabs for 2 days, then 4 tabs for 2 days, then 3 tabs for 2 days, 2 tabs for 2 days, then 1 tab by mouth daily for 2 days 12/03/14   Lorelle Formosa Patel-Mills, PA-C   BP 126/79 mmHg  Pulse 97  Temp(Src) 98.7 F (37.1 C) (Oral)  Resp 16  Ht 5' 4.25" (1.632 m)  Wt 95.255 kg  BMI 35.76 kg/m2  SpO2 100% Physical Exam  Constitutional: She is oriented to person, place, and time.  HENT:  Right Ear: External ear normal.  Left Ear: External ear normal.  Nose: Nose normal.  Mouth/Throat: Oropharynx is clear and moist. No oropharyngeal exudate.  Eyes: Conjunctivae and EOM are normal. Pupils are equal, round, and reactive to light.  Neck: Normal range of motion. Neck supple.  Cardiovascular: Normal rate, regular rhythm, normal heart sounds and intact distal pulses.  Pulmonary/Chest: Effort normal and breath sounds normal. No respiratory distress. She has no wheezes. She exhibits no tenderness.  Abdominal: Soft. Bowel sounds are normal. She exhibits no distension. There is no tenderness. There is no rebound and no guarding.  Genitourinary:  declined  Musculoskeletal: She exhibits no edema.  Neurological: She is alert and oriented to person, place, and time. No cranial nerve deficit.  Skin: Skin is warm and dry.  Psychiatric: She has a normal mood and affect.  Nursing note and vitals reviewed.    ED Course  Procedures (including critical care time) Labs Review Labs Reviewed  PREGNANCY, URINE   URINALYSIS, ROUTINE W REFLEX MICROSCOPIC (NOT AT [facility])    Imaging Review No results found. I have personally reviewed and evaluated these images and lab results as part of my medical decision-making.   EKG Interpretation None      MDM   Final diagnoses:  Encounter for pregnancy test with result negative   UA and urine preg negative. Pt has nonfocal exam. She declines pelvic exam or further workup. VSS. Pt is stable for discharge. ER return precautions given. REsource giude given to re-establish PCP and OBGYN care.  Carlene Coria, PA-C 07/17/15 1408  Glynn Octave, MD 07/17/15 [phone]

## 2015-07-16 NOTE — Discharge Instructions (Signed)
Take medications as prescribed. Return to the emergency room for worsening condition or new concerning symptoms. Follow up with your regular doctor. If you don't have a regular doctor use one of the numbers below to establish a primary care doctor. ° ° °Emergency Department Resource Guide °1) Find a Doctor and Pay Out of Pocket °Although you won't have to find out who is covered by your insurance plan, it is a good idea to ask around and get recommendations. You will then need to call the office and see if the doctor you have chosen will accept you as a new patient and what types of options they offer for patients who are self-pay. Some doctors offer discounts or will set up payment plans for their patients who do not have insurance, but you will need to ask so you aren't surprised when you get to your appointment. ° °2) Contact Your Local Health Department °Not all health departments have doctors that can see patients for sick visits, but many do, so it is worth a call to see if yours does. If you don't know where your local health department is, you can check in your phone book. The CDC also has a tool to help you locate your state's health department, and many state websites also have listings of all of their local health departments. ° °3) Find a Walk-in Clinic °If your illness is not likely to be very severe or complicated, you may want to try a walk in clinic. These are popping up all over the country in pharmacies, drugstores, and shopping centers. They're usually staffed by nurse practitioners or physician assistants that have been trained to treat common illnesses and complaints. They're usually fairly quick and inexpensive. However, if you have serious medical issues or chronic medical problems, these are probably not your best option. ° °No Primary Care Doctor: °- Call [facility] at  832-8000 - they can help you locate a primary care doctor that  accepts your insurance, provides certain services,  etc. °- Physician Referral Service- 1-800-533-3463 ° °Emergency Department Resource Guide °1) Find a Doctor and Pay Out of Pocket °Although you won't have to find out who is covered by your insurance plan, it is a good idea to ask around and get recommendations. You will then need to call the office and see if the doctor you have chosen will accept you as a new patient and what types of options they offer for patients who are self-pay. Some doctors offer discounts or will set up payment plans for their patients who do not have insurance, but you will need to ask so you aren't surprised when you get to your appointment. ° °2) Contact Your Local Health Department °Not all health departments have doctors that can see patients for sick visits, but many do, so it is worth a call to see if yours does. If you don't know where your local health department is, you can check in your phone book. The CDC also has a tool to help you locate your state's health department, and many state websites also have listings of all of their local health departments. ° °3) Find a Walk-in Clinic °If your illness is not likely to be very severe or complicated, you may want to try a walk in clinic. These are popping up all over the country in pharmacies, drugstores, and shopping centers. They're usually staffed by nurse practitioners or physician assistants that have been trained to treat common illnesses and complaints. They're usually fairly   quick and inexpensive. However, if you have serious medical issues or chronic medical problems, these are probably not your best option. ° °No Primary Care Doctor: °- Call [facility] at  832-8000 - they can help you locate a primary care doctor that  accepts your insurance, provides certain services, etc. °- Physician Referral Service- 1-800-533-3463 ° °Chronic Pain Problems: °Organization         Address  Phone   Notes  °[facility]  ([phone]) [phone] Patients need to be referred by  their primary care doctor.  ° °Medication Assistance: °Organization         Address  Phone   Notes  °[place] [facility] [address] °[place], [place] [address] ([phone]) 641-8030 --Must be a resident of [place] °-- Must have NO insurance coverage whatsoever (no Medicaid/ Medicare, etc.) °-- The pt. MUST have a primary care doctor that directs their care regularly and follows them in the community °  °[facility]  (866) 331-1348   °[employer]  (888) 892-1162   ° °Agencies that provide inexpensive medical care: °Organization         Address  Phone   Notes  °[facility]  ([phone]) [phone]   °[facility]    (336) 832-7272   °[facility] [address] °[place], [place] [address] (336) [phone]   °[facility] [address], °[place] (336) [phone]   °[facility]    (336) 373-0678   °[facility]    (336) 272-1050   °[facility] ° [address], [place] Phone:  (336) [phone], Fax:  ([phone]) [phone] Hours of Operation:  9 am - 6 pm, M-F.  Also accepts Medicaid/Medicare and self-pay.  °[facility] ° [address], [place] Phone: (336) [phone], Fax: ([phone]) [phone]. Hours of Operation:  8:30 am - 5:30 pm, M-F.  Also accepts Medicaid and self-pay.  °[facility] [place] [address], [place] Phone: (336) [phone]   °[facility] [address], [place], [place] (336)723-1848, Ext. 123 Mondays & Thursdays: 7-9 AM.  First 15 patients are seen on a first come, first serve basis. °  ° °Medicaid-accepting [place] Providers: ° °Organization         Address  Phone   Notes  °[facility] [address], [place] ([phone]) [phone] Also accepts self-pay patients.  °[facility] [address], [place] ° (336) [phone]   °[facility]  [address], [place] (336) [phone]   °[facility] [address] [place] [address], [place] (336) [phone]   °[facility] [address], [place]  ° ([phone]) [phone] Only accepts [place] Access Medicaid patients after they have their name applied to their card.  ° °Self-Pay (no insurance) in [place]: ° °Organization         Address  Phone   Notes  °Sickle Cell Patients, [facility] [address], [place] (336) [phone]   °[facility] [address],  ([phone]) [phone]   °[facility] [place] ° [address], [place] ([phone]) [phone]   °[facility]/Dr. Osei-Bonsu ° [address] [place] [address],  or [address], [place] [phone]) [phone] Phone number   for both [place] and [place] locations is the same.  °[facility] [address], [place] ([phone]) [phone]   °[facility] [address] [place] [address], [place] or [address] ([phone]) [phone] °(336) [phone]   °[facility] [address],  (336) [phone], phone; ([phone]) [phone], fax Sees patients 1st and 3rd Saturday of every month.  Must not qualify for public or private insurance (i.e. Medicaid, Medicare, [place] Health Choice, Veterans' Benefits) • Household income should be no more than 200% of the poverty level •The clinic cannot treat you if you are pregnant or think you are pregnant • Sexually transmitted diseases are not treated at the clinic.  ° ° ° °

## 2015-07-16 NOTE — ED Notes (Signed)
Lower pelvic pain for the last several weeks as well as nausea and concerns that she may be pregnant again.  Pt states she was seen in November and had a positive pregnancy test, then miscarried and thinks she may be pregnant again.  Pt has not taken an OTC preg test, denies bleeding and denies any vaginal discharge.

## 2017-01-05 ENCOUNTER — Encounter (HOSPITAL_BASED_OUTPATIENT_CLINIC_OR_DEPARTMENT_OTHER): Payer: Self-pay | Admitting: Emergency Medicine

## 2017-01-05 ENCOUNTER — Emergency Department (HOSPITAL_BASED_OUTPATIENT_CLINIC_OR_DEPARTMENT_OTHER)
Admission: EM | Admit: 2017-01-05 | Discharge: 2017-01-05 | Disposition: A | Discharge status: Home / Self Care | Payer: Medicaid Other | Attending: Emergency Medicine | Admitting: Emergency Medicine

## 2017-01-05 DIAGNOSIS — Z79899 Other long term (current) drug therapy: Secondary | ICD-10-CM | POA: Insufficient documentation

## 2017-01-05 DIAGNOSIS — R21 Rash and other nonspecific skin eruption: Secondary | ICD-10-CM | POA: Insufficient documentation

## 2017-01-05 DIAGNOSIS — F17200 Nicotine dependence, unspecified, uncomplicated: Secondary | ICD-10-CM | POA: Diagnosis not present

## 2017-01-05 DIAGNOSIS — W57XXXA Bitten or stung by nonvenomous insect and other nonvenomous arthropods, initial encounter: Secondary | ICD-10-CM | POA: Insufficient documentation

## 2017-01-05 MED ORDER — HYDROXYZINE HCL 25 MG PO TABS: 25.0000 mg | ORAL_TABLET | Freq: Four times a day (QID) | ORAL | 0 refills | Status: DC

## 2017-01-05 MED ORDER — PREDNISONE 20 MG PO TABS: 40.0000 mg | ORAL_TABLET | Freq: Every day | ORAL | 0 refills | Status: DC

## 2017-01-05 NOTE — ED Triage Notes (Signed)
"   I think I am allergic to mosquito's because I have some places on my leg that area big and itchy" Has used OTC cream

## 2017-01-05 NOTE — Discharge Instructions (Signed)
Take your medication as prescribed as needed for itching. You may also apply calamine lotion or take Benadryl as prescribed over-the-counter as needed for additional relief. I recommend trying to refrain from scratching the rash as this may worsen her symptoms. Please follow up with a primary care provider from the Resource Guide provided below in one week if your rash has not improved. Please return to the Emergency Department if symptoms worsen or new onset of fever, facial swelling, or lesions, shortness of breath/difficulty breathing, abdominal pain, vomiting, new/worsening rash, drainage.

## 2017-01-05 NOTE — ED Provider Notes (Signed)
[facility] Provider Note   CSN: [id number] Arrival date & time: 01/05/17  1640  By signing my name below, I, Rosario AdieWilliam Andrew Hiatt, attest that this documentation has been prepared under the direction and in the presence of Melburn HakeNicole Donnae Michels, PA-C.  Electronically Signed: Rosario AdieWilliam Andrew Hiatt, ED Scribe. 01/05/17. 5:06 PM.  History   Chief Complaint Chief Complaint  Patient presents with  . Rash   The history is provided by the patient. No language interpreter was used.   HPI Comments: Emma Ali is a 31 y.o. female with no pertinent PMHx, who presents to the Emergency Department complaining of intermittent areas of rash to the right leg beginning approximately one week ago. She reports that these areas are clusters of red, raised, pruritic areas have been present throughout the week, but worse in the mornings. She has been taking Benadryl and applying Cortisone cream to the areas without significant improvement. She has not been outside for prolonged periods of time since the onset of her rash. No drainage from the areas or rashes elsewhere. No new soaps, lotions, detergents, foods, animals, plants, or medications otherwise. No contact with similar rashes. No known allergies. She denies fever, facial swelling, mouth lesions, shortness of breath, trouble swallowing, sore throat, abdominal pain, nausea, vomiting, or any other associated symptoms.   Past Medical History:  Diagnosis Date  . Thyroid disease    There are no active problems to display for this patient.  History reviewed. No pertinent surgical history.  OB History    No data available     Home Medications    Prior to Admission medications   Medication Sig Start Date End Date Taking? Authorizing Provider  levothyroxine (SYNTHROID, LEVOTHROID) 75 MCG tablet Take 75 mcg by mouth daily before breakfast.   Yes [provider]  hydrocortisone 2.5 % lotion Apply topically 2 (two) times daily. 12/03/14    Patel-Mills, Lorelle FormosaHanna, PA-C  hydrOXYzine (ATARAX/VISTARIL) 25 MG tablet Take 1 tablet (25 mg total) by mouth every 6 (six) hours. 01/05/17   Barrett HenleNadeau, Ravinder Hofland Elizabeth, PA-C  ivermectin (STROMECTOL) 3 MG TABS tablet Take 6 tablets (18,000 mcg total) by mouth once. 12/03/14   Patel-Mills, Lorelle FormosaHanna, PA-C  levothyroxine (SYNTHROID, LEVOTHROID) 75 MCG tablet Take 75 mcg by mouth daily before breakfast.    [provider]  metroNIDAZOLE (FLAGYL) 500 MG tablet Take 1 tablet (500 mg total) by mouth 2 (two) times daily. 01/19/15   Jerelyn ScottLinker, Martha, MD  ondansetron (ZOFRAN) 4 MG tablet Take 1 tablet (4 mg total) by mouth every 6 (six) hours. 05/15/15   Patel-Mills, Lorelle FormosaHanna, PA-C  permethrin (ELIMITE) 5 % cream Apply 1 application topically once. 12/03/14   Patel-Mills, Lorelle FormosaHanna, PA-C  predniSONE (DELTASONE) 20 MG tablet Take 2 tablets (40 mg total) by mouth daily. 01/05/17   Barrett HenleNadeau, [place] D[place]itt Elizabeth, PA-C   Family History No family history on file.  Social History Social History  Substance Use Topics  . Smoking status: Light Tobacco Smoker  . Smokeless tobacco: Never Used  . Alcohol use Yes     Comment: occasional   Allergies   Amoxicillin  Review of Systems Review of Systems  Constitutional: Negative for fever.  HENT: Negative for facial swelling, mouth sores, sore throat and trouble swallowing.   Respiratory: Negative for shortness of breath.   Gastrointestinal: Negative for abdominal pain, nausea and vomiting.  Skin: Positive for rash.  All other systems reviewed and are negative.  Physical Exam Updated Vital Signs BP 124/79 (BP Location: Right Arm)  Pulse 74   Temp 98.1 F (36.7 C) (Oral)   Resp 18   Ht 5\' 4"  (1.626 m)   Wt 92.4 kg (203 lb 11.3 oz)   SpO2 97%   BMI 34.97 kg/m   Physical Exam  Constitutional: She is oriented to person, place, and time. She appears well-developed and well-nourished.  HENT:  Head: Normocephalic and atraumatic.  Mouth/Throat: Uvula is midline, oropharynx  is clear and moist and mucous membranes are normal. No oral lesions. No trismus in the jaw. No uvula swelling. No oropharyngeal exudate, posterior oropharyngeal edema, posterior oropharyngeal erythema or tonsillar abscesses. No tonsillar exudate.  Eyes: Conjunctivae and EOM are normal. Right eye exhibits no discharge. Left eye exhibits no discharge. No scleral icterus.  Neck: Normal range of motion. Neck supple.  Cardiovascular: Normal rate, regular rhythm and normal heart sounds.   No murmur heard. Pulmonary/Chest: Effort normal and breath sounds normal. No respiratory distress. She has no wheezes. She has no rales.  Abdominal: Soft. There is no tenderness.  Neurological: She is alert and oriented to person, place, and time.  Skin: Skin is warm and dry.  Multiple small erythematous papules present to bilateral anterior and posterior proximal thighs with excoriations present. No surrounding erythema, warmth, induration, fluctuance, or drainage. Non-tender. No vesicles, pustules, or bulla present. No lesions on palms or soles.   Nursing note and vitals reviewed.  ED Treatments / Results  DIAGNOSTIC STUDIES: Oxygen Saturation is 97% on RA, normal by my interpretation.   COORDINATION OF CARE: 5:06 PM-Discussed next steps with pt. Pt verbalized understanding and is agreeable with the plan.   Labs (all labs ordered are listed, but only abnormal results are displayed) Labs Reviewed - No data to display  EKG  EKG Interpretation None      Radiology No results found.  Procedures Procedures   Medications Ordered in ED Medications - No data to display  Initial Impression / Assessment and Plan / ED Course  I have reviewed the triage vital signs and the nursing notes.  Pertinent labs & imaging results that were available during my care of the patient were reviewed by me and considered in my medical decision making (see chart for details).     Rash consistent with insect/mosquito  bites. Patient denies any difficulty breathing or swallowing.  Pt has a patent airway without stridor and is handling secretions without difficulty; no angioedema. No blisters, no pustules, no warmth, no draining sinus tracts, no superficial abscesses, no bullous impetigo, no vesicles, no desquamation, no target lesions with dusky purpura or a central bulla. Not tender to touch. No concern for superimposed infection. No concern for SJS, TEN, TSS, tick borne illness, syphilis or other life-threatening condition. Will discharge home with short course of steroids, vistaril and recommend Benadryl/Calamine lotion OTC as needed for pruritis. Discussed return precautions.    Final Clinical Impressions(s) / ED Diagnoses   Final diagnoses:  Insect bite, initial encounter   New Prescriptions Discharge Medication List as of 01/05/2017  5:15 PM    START taking these medications   Details  hydrOXYzine (ATARAX/VISTARIL) 25 MG tablet Take 1 tablet (25 mg total) by mouth every 6 (six) hours., Starting Wed 01/05/2017, Print    predniSONE (DELTASONE) 20 MG tablet Take 2 tablets (40 mg total) by mouth daily., Starting Wed 01/05/2017, Print       I personally performed the services described in this documentation, which was scribed in my presence. The recorded information has been reviewed and is accurate.  Barrett Henle, PA-C 01/05/17 1742    Nira Conn, MD 01/06/17 [phone]

## 2018-01-23 ENCOUNTER — Other Ambulatory Visit: Payer: Self-pay

## 2018-01-23 ENCOUNTER — Emergency Department (HOSPITAL_BASED_OUTPATIENT_CLINIC_OR_DEPARTMENT_OTHER)
Admission: EM | Admit: 2018-01-23 | Discharge: 2018-01-23 | Disposition: A | Discharge status: Home / Self Care | Payer: Self-pay | Attending: Emergency Medicine | Admitting: Emergency Medicine

## 2018-01-23 ENCOUNTER — Encounter: Payer: Self-pay | Admitting: Emergency Medicine

## 2018-01-23 DIAGNOSIS — Z79899 Other long term (current) drug therapy: Secondary | ICD-10-CM | POA: Insufficient documentation

## 2018-01-23 DIAGNOSIS — M5440 Lumbago with sciatica, unspecified side: Secondary | ICD-10-CM | POA: Insufficient documentation

## 2018-01-23 DIAGNOSIS — E079 Disorder of thyroid, unspecified: Secondary | ICD-10-CM | POA: Insufficient documentation

## 2018-01-23 DIAGNOSIS — M545 Low back pain: Secondary | ICD-10-CM

## 2018-01-23 DIAGNOSIS — F17200 Nicotine dependence, unspecified, uncomplicated: Secondary | ICD-10-CM | POA: Insufficient documentation

## 2018-01-23 LAB — URINALYSIS, ROUTINE W REFLEX MICROSCOPIC
Bilirubin Urine: NEGATIVE
GLUCOSE, UA: NEGATIVE mg/dL
Ketones, ur: NEGATIVE mg/dL
Leukocytes, UA: NEGATIVE
Nitrite: NEGATIVE
PROTEIN: NEGATIVE mg/dL
pH: 6 (ref 5.0–8.0)

## 2018-01-23 LAB — URINALYSIS, MICROSCOPIC (REFLEX)

## 2018-01-23 LAB — PREGNANCY, URINE: Preg Test, Ur: NEGATIVE

## 2018-01-23 MED ORDER — KETOROLAC TROMETHAMINE 15 MG/ML IJ SOLN
30.0000 mg | Freq: Once | INTRAMUSCULAR | Status: AC
  Administered 2018-01-23: 30 mg via INTRAMUSCULAR
  Filled 2018-01-23: qty 2

## 2018-01-23 NOTE — Discharge Instructions (Signed)
Take NSAIDs or Tylenol as needed for the next week. Take this medicine with food. Try gentle stretching Use a heating pad for sore muscles - use for 20 minutes several times a day Try topical medicines (Icy Hot, Tiger balm, lidocaine patch, essential oils, etc) Follow up with your doctor

## 2018-01-23 NOTE — ED Triage Notes (Signed)
Presents with onset of lower back pain that began this AM, and is described as severe. SHe began her period on Saturday and usually has some back painwith it, but it is never this severe. Pain is described difficulty walking,  in the lower lumbar that is dull and aching and constant and does not radiate. She denies urinary symtpoms. Denie sback injury. Denies change in menstruation.

## 2018-01-23 NOTE — ED Provider Notes (Signed)
[facility] EMERGENCY DEPARTMENT Provider Note   CSN: [id number] Arrival date & time: 01/23/18  1540     History   Chief Complaint Chief Complaint  Patient presents with  . Back Pain    HPI Emma Ali is a 32 y.o. female who presents with low back pain.  No significant past medical history.  The patient states that this morning she woke up and was getting ready for work and then started to have gradually worsening low back pain.  It radiates to the right buttocks and hip.  She states that she has had similar pain in the past and was diagnosed with sciatica however that time her pain radiated down to her foot.  She notes that she went hiking on Saturday but did not have immediate pain after this.  Today she took ibuprofen which did not improve her pain therefore she came to the ED.  She also notes that she is on her period and frequently has low back pain with this however this does not feel similar.  She states that she does not want to take any medicines but would like a shot for pain. No fever, syncope, trauma, unexplained weight loss, hx of cancer, loss of bowel/bladder function, saddle anesthesia, urinary retention, IVDU.   HPI  Past Medical History:  Diagnosis Date  . Thyroid disease     There are no active problems to display for this patient.   History reviewed. No pertinent surgical history.   OB History   None      Home Medications    Prior to Admission medications   Medication Sig Start Date End Date Taking? Authorizing Provider  hydrocortisone 2.5 % lotion Apply topically 2 (two) times daily. 12/03/14   Patel-Mills, Lorelle Formosa, PA-C  hydrOXYzine (ATARAX/VISTARIL) 25 MG tablet Take 1 tablet (25 mg total) by mouth every 6 (six) hours. 01/05/17   Barrett Henle, PA-C  ivermectin (STROMECTOL) 3 MG TABS tablet Take 6 tablets (18,000 mcg total) by mouth once. 12/03/14   Patel-Mills, Lorelle Formosa, PA-C  levothyroxine (SYNTHROID, LEVOTHROID) 75 MCG tablet Take 75  mcg by mouth daily before breakfast.    [provider]  levothyroxine (SYNTHROID, LEVOTHROID) 75 MCG tablet Take 75 mcg by mouth daily before breakfast.    [provider]  metroNIDAZOLE (FLAGYL) 500 MG tablet Take 1 tablet (500 mg total) by mouth 2 (two) times daily. 01/19/15   Mabe, Latanya Maudlin, MD  ondansetron (ZOFRAN) 4 MG tablet Take 1 tablet (4 mg total) by mouth every 6 (six) hours. 05/15/15   Patel-Mills, Lorelle Formosa, PA-C  permethrin (ELIMITE) 5 % cream Apply 1 application topically once. 12/03/14   Patel-Mills, Lorelle Formosa, PA-C  predniSONE (DELTASONE) 20 MG tablet Take 2 tablets (40 mg total) by mouth daily. 01/05/17   Barrett Henle, PA-C    Family History History reviewed. No pertinent family history.  Social History Social History   Tobacco Use  . Smoking status: Light Tobacco Smoker  . Smokeless tobacco: Never Used  Substance Use Topics  . Alcohol use: Yes    Comment: occasional  . Drug use: No     Allergies   Amoxicillin and Penicillins   Review of Systems Review of Systems  Constitutional: Negative for fever.  Musculoskeletal: Positive for back pain.  Skin: Negative for wound.  Neurological: Negative for weakness and numbness.     Physical Exam Updated Vital Signs BP 117/86 (BP Location: Right Arm)   Pulse 77   Temp 98.3 F (36.8 C) (  Oral)   Resp 18   Ht 5\' 4"  (1.626 m)   Wt 89.8 kg (198 lb)   LMP 01/21/2018 (Exact Date)   SpO2 99%   BMI 33.99 kg/m   Physical Exam  Constitutional: She is oriented to person, place, and time. She appears well-developed and well-nourished. No distress.  HENT:  Head: Normocephalic and atraumatic.  Eyes: Pupils are equal, round, and reactive to light. Conjunctivae are normal. Right eye exhibits no discharge. Left eye exhibits no discharge. No scleral icterus.  Neck: Normal range of motion.  Cardiovascular: Normal rate.  Pulmonary/Chest: Effort normal. No respiratory distress.  Abdominal: She exhibits no  distension.  Musculoskeletal:  Back: Inspection: No masses, deformity, or rash Palpation: Lower lumbar midline spinal tenderness. No paraspinal muscle tenderness. No buttocks tenderness or lateral hip tenderness Strength: 5/5 in lower extremities and normal plantar and dorsiflexion Sensation: Intact sensation with light touch in lower extremities bilaterally SLR: Negative seated straight leg raise Gait: Normal gait   Neurological: She is alert and oriented to person, place, and time.  Skin: Skin is warm and dry.  Psychiatric: She has a normal mood and affect. Her behavior is normal.  Nursing note and vitals reviewed.    ED Treatments / Results  Labs (all labs ordered are listed, but only abnormal results are displayed) Labs Reviewed  URINALYSIS, ROUTINE W REFLEX MICROSCOPIC - Abnormal; Notable for the following components:      Result Value   Specific Gravity, Urine <1.005 (*)    Hgb urine dipstick MODERATE (*)    All other components within normal limits  URINALYSIS, MICROSCOPIC (REFLEX) - Abnormal; Notable for the following components:   Bacteria, UA RARE (*)    All other components within normal limits  PREGNANCY, URINE    EKG None  Radiology No results found.  Procedures Procedures (including critical care time)  Medications Ordered in ED Medications  ketorolac (TORADOL) 15 MG/ML injection 30 mg (30 mg Intramuscular Given 01/23/18 1650)     Initial Impression / Assessment and Plan / ED Course  I have reviewed the triage vital signs and the nursing notes.  Pertinent labs & imaging results that were available during my care of the patient were reviewed by me and considered in my medical decision making (see chart for details).  32 year old female with acute low back pain which feels tment with her doctor for follow-up. similar to prior episodes of sciatica.  Likely exacerbated from hiking several days ago.  Her vital signs are normal.  She has normal strength and  is ambulatory.  There is no trauma therefore imaging not indicated.  Patient declines prescriptions for pain medicines/muscle relaxers however is open to IM Toradol which was given.  She was advised to continue conservative measures and to make an appointment with her PCP for follow up.  Final Clinical Impressions(s) / ED Diagnoses   Final diagnoses:  Acute midline low back pain, with sciatica presence unspecified    ED Discharge Orders    None       Bethel BornGekas, Fannie Gathright Marie, PA-C 01/23/18 1657    Terrilee FilesButler, Michael C, MD 01/24/18 1016

## 2018-04-18 ENCOUNTER — Emergency Department (HOSPITAL_BASED_OUTPATIENT_CLINIC_OR_DEPARTMENT_OTHER): Payer: BLUE CROSS/BLUE SHIELD

## 2018-04-18 ENCOUNTER — Emergency Department (HOSPITAL_BASED_OUTPATIENT_CLINIC_OR_DEPARTMENT_OTHER)
Admission: EM | Admit: 2018-04-18 | Discharge: 2018-04-18 | Disposition: A | Discharge status: Home / Self Care | Payer: BLUE CROSS/BLUE SHIELD | Attending: Emergency Medicine | Admitting: Emergency Medicine

## 2018-04-18 ENCOUNTER — Encounter (HOSPITAL_BASED_OUTPATIENT_CLINIC_OR_DEPARTMENT_OTHER): Payer: Self-pay

## 2018-04-18 DIAGNOSIS — J069 Acute upper respiratory infection, unspecified: Secondary | ICD-10-CM

## 2018-04-18 DIAGNOSIS — E079 Disorder of thyroid, unspecified: Secondary | ICD-10-CM | POA: Diagnosis not present

## 2018-04-18 DIAGNOSIS — F17200 Nicotine dependence, unspecified, uncomplicated: Secondary | ICD-10-CM | POA: Diagnosis not present

## 2018-04-18 DIAGNOSIS — B9789 Other viral agents as the cause of diseases classified elsewhere: Secondary | ICD-10-CM | POA: Diagnosis not present

## 2018-04-18 DIAGNOSIS — Z79899 Other long term (current) drug therapy: Secondary | ICD-10-CM | POA: Insufficient documentation

## 2018-04-18 DIAGNOSIS — J029 Acute pharyngitis, unspecified: Secondary | ICD-10-CM | POA: Diagnosis present

## 2018-04-18 MED ORDER — CETIRIZINE-PSEUDOEPHEDRINE ER 5-120 MG PO TB12: 1.0000 | ORAL_TABLET | Freq: Two times a day (BID) | ORAL | 0 refills | Status: AC

## 2018-04-18 MED ORDER — FLUTICASONE PROPIONATE 50 MCG/ACT NA SUSP: 1.0000 | Freq: Every day | NASAL | 0 refills | Status: AC

## 2018-04-18 MED ORDER — IBUPROFEN 600 MG PO TABS: 600.0000 mg | ORAL_TABLET | Freq: Four times a day (QID) | ORAL | 0 refills | Status: DC | PRN

## 2018-04-18 MED ORDER — ACETAMINOPHEN 500 MG PO TABS: 500.0000 mg | ORAL_TABLET | Freq: Four times a day (QID) | ORAL | 0 refills | Status: AC | PRN

## 2018-04-18 MED ORDER — BENZONATATE 100 MG PO CAPS: 100.0000 mg | ORAL_CAPSULE | Freq: Three times a day (TID) | ORAL | 0 refills | Status: AC

## 2018-04-18 MED FILL — BENZONATATE 100 MG CAPSULE: 100 | 7 days supply | Qty: 21 | Fill #0

## 2018-04-18 NOTE — ED Triage Notes (Signed)
Pt c/o sore throat, chest congestion, productive cough x1wk

## 2018-04-18 NOTE — ED Provider Notes (Signed)
[facility] EMERGENCY DEPARTMENT Provider Note   CSN: [id number] Arrival date & time: 04/18/18  0940     History   Chief Complaint Chief Complaint  Patient presents with  . Sore Throat    HPI Emma Ali is a 32 y.o. female with history of hypothyroidism who presents with a one-week history of productive cough, sore throat, nasal congestion.  Patient reports she has had phlegm in her throat.  She has had associated sore throat with ear pain when she swallows.  She has taken ibuprofen at home and tea with minimal relief.  She denies any sick contacts.  She denies any abdominal pain, nausea or vomiting.  She has had a little bit of constipation.  She denies dysuria.  HPI  Past Medical History:  Diagnosis Date  . Thyroid disease     There are no active problems to display for this patient.   History reviewed. No pertinent surgical history.   OB History   None      Home Medications    Prior to Admission medications   Medication Sig Start Date End Date Taking? Authorizing Provider  acetaminophen (TYLENOL) 500 MG tablet Take 1 tablet (500 mg total) by mouth every 6 (six) hours as needed. 04/18/18   Santa Abdelrahman, Waylan Boga, PA-C  benzonatate (TESSALON) 100 MG capsule Take 1 capsule (100 mg total) by mouth every 8 (eight) hours. 04/18/18   Haidy Kackley, Waylan Boga, PA-C  cetirizine-pseudoephedrine (ZYRTEC-D) 5-120 MG tablet Take 1 tablet by mouth 2 (two) times daily. 04/18/18   Dodd Schmid, Waylan Boga, PA-C  fluticasone (FLONASE) 50 MCG/ACT nasal spray Place 1 spray into both nostrils daily. 04/18/18   Amier Hoyt, Waylan Boga, PA-C  ibuprofen (ADVIL,MOTRIN) 600 MG tablet Take 1 tablet (600 mg total) by mouth every 6 (six) hours as needed. 04/18/18   Kiano Terrien, Waylan Boga, PA-C  levothyroxine (SYNTHROID, LEVOTHROID) 75 MCG tablet Take 75 mcg by mouth daily before breakfast.    [provider]  levothyroxine (SYNTHROID, LEVOTHROID) 75 MCG tablet Take 75 mcg by mouth daily before breakfast.     [provider]  ondansetron (ZOFRAN) 4 MG tablet Take 1 tablet (4 mg total) by mouth every 6 (six) hours. 05/15/15   Patel-Mills, Lorelle Formosa, PA-C    Family History No family history on file.  Social History Social History   Tobacco Use  . Smoking status: Light Tobacco Smoker  . Smokeless tobacco: Never Used  Substance Use Topics  . Alcohol use: Yes    Comment: occasional  . Drug use: No     Allergies   Amoxicillin and Penicillins   Review of Systems Review of Systems  Constitutional: Positive for appetite change and fever (subjective, "felt hot" ). Negative for chills.  HENT: Positive for congestion, ear pain and sore throat. Negative for facial swelling.   Respiratory: Positive for cough. Negative for shortness of breath.   Cardiovascular: Negative for chest pain.  Gastrointestinal: Positive for constipation. Negative for abdominal pain, blood in stool, diarrhea, nausea and vomiting.  Genitourinary: Negative for dysuria.  Musculoskeletal: Negative for back pain.  Skin: Negative for rash and wound.  Neurological: Negative for headaches.  Psychiatric/Behavioral: The patient is not nervous/anxious.      Physical Exam Updated Vital Signs BP 116/80 (BP Location: Left Arm)   Pulse 84   Temp 98.4 F (36.9 C) (Oral)   Resp 20   LMP 04/18/2018   SpO2 98%   Physical Exam  Constitutional: She appears well-developed and well-nourished. No distress.  HENT:  Head: Normocephalic and atraumatic.  Right Ear: Tympanic membrane normal.  Left Ear: Tympanic membrane normal.  Mouth/Throat: Posterior oropharyngeal edema and posterior oropharyngeal erythema present. No oropharyngeal exudate or tonsillar abscesses. Tonsils are 1+ on the right. Tonsils are 1+ on the left. No tonsillar exudate.  Eyes: Pupils are equal, round, and reactive to light. Conjunctivae are normal. Right eye exhibits no discharge. Left eye exhibits no discharge. No scleral icterus.  Neck: Normal range of  motion. Neck supple. No thyromegaly present.  Cardiovascular: Normal rate, regular rhythm, normal heart sounds and intact distal pulses. Exam reveals no gallop and no friction rub.  No murmur heard. Pulmonary/Chest: Effort normal and breath sounds normal. No stridor. No respiratory distress. She has no wheezes. She has no rales.  Abdominal: Soft. Bowel sounds are normal. She exhibits no distension. There is no tenderness. There is no rebound and no guarding.  Musculoskeletal: She exhibits no edema.  Lymphadenopathy:    She has cervical adenopathy (mobile, left, nontender).  Neurological: She is alert. Coordination normal.  Skin: Skin is warm and dry. No rash noted. She is not diaphoretic. No pallor.  Psychiatric: She has a normal mood and affect.  Nursing note and vitals reviewed.    ED Treatments / Results  Labs (all labs ordered are listed, but only abnormal results are displayed) Labs Reviewed - No data to display  EKG None  Radiology Dg Chest 2 View  Result Date: 04/18/2018 CLINICAL DATA:  Productive cough. EXAM: CHEST - 2 VIEW COMPARISON:  None. FINDINGS: The heart size and mediastinal contours are within normal limits. Both lungs are clear. The visualized skeletal structures are unremarkable. IMPRESSION: No active cardiopulmonary disease. Electronically Signed   By: Lupita Raider, M.D.   On: 04/18/2018 10:20    Procedures Procedures (including critical care time)  Medications Ordered in ED Medications - No data to display   Initial Impression / Assessment and Plan / ED Course  I have reviewed the triage vital signs and the nursing notes.  Pertinent labs & imaging results that were available during my care of the patient were reviewed by me and considered in my medical decision making (see chart for details).     Pt symptoms consistent with URI. CXR negative for acute infiltrate.  Low suspicion for strep considering physical exam and productive cough, but offered  strep PCR considering patient reported a severe sore throat yesterday.  However, patient declined.  Pt will be discharged with symptomatic treatment.  Discussed return precautions.  Patient understands and agrees with plan.  Pt is hemodynamically stable & in NAD prior to discharge.  Patient vitals stable throughout ED course and discharged in satisfactory condition.   Final Clinical Impressions(s) / ED Diagnoses   Final diagnoses:  Viral URI with cough    ED Discharge Orders         Ordered    benzonatate (TESSALON) 100 MG capsule  Every 8 hours     04/18/18 1040    cetirizine-pseudoephedrine (ZYRTEC-D) 5-120 MG tablet  2 times daily     04/18/18 1040    acetaminophen (TYLENOL) 500 MG tablet  Every 6 hours PRN     04/18/18 1040    ibuprofen (ADVIL,MOTRIN) 600 MG tablet  Every 6 hours PRN     04/18/18 1040    fluticasone (FLONASE) 50 MCG/ACT nasal spray  Daily     04/18/18 1040           Dohn Stclair, [place], PA-C 04/18/18 1041  Tegeler, Canary Brim, MD 04/18/18 1304

## 2018-04-18 NOTE — Discharge Instructions (Signed)
Take Tessalon every 8 hours as needed for cough.  Take Flonase once daily in each nostril to help with nasal congestion.  Take Zyrtec-D to help with nasal congestion and ear pain.  Take Tylenol and ibuprofen alternating as prescribed, as needed for sore throat and body aches.  Please return the emergency department if you develop any new or worsening symptoms.

## 2018-08-23 ENCOUNTER — Other Ambulatory Visit: Payer: Self-pay

## 2018-08-23 ENCOUNTER — Emergency Department (HOSPITAL_BASED_OUTPATIENT_CLINIC_OR_DEPARTMENT_OTHER)
Admission: EM | Admit: 2018-08-23 | Discharge: 2018-08-23 | Disposition: A | Discharge status: Home / Self Care | Payer: No Typology Code available for payment source | Attending: Emergency Medicine | Admitting: Emergency Medicine

## 2018-08-23 ENCOUNTER — Emergency Department (HOSPITAL_BASED_OUTPATIENT_CLINIC_OR_DEPARTMENT_OTHER): Payer: No Typology Code available for payment source

## 2018-08-23 ENCOUNTER — Encounter (HOSPITAL_BASED_OUTPATIENT_CLINIC_OR_DEPARTMENT_OTHER): Payer: Self-pay | Admitting: *Deleted

## 2018-08-23 DIAGNOSIS — Z720 Tobacco use: Secondary | ICD-10-CM | POA: Insufficient documentation

## 2018-08-23 DIAGNOSIS — E039 Hypothyroidism, unspecified: Secondary | ICD-10-CM | POA: Diagnosis not present

## 2018-08-23 DIAGNOSIS — Y92410 Unspecified street and highway as the place of occurrence of the external cause: Secondary | ICD-10-CM | POA: Insufficient documentation

## 2018-08-23 DIAGNOSIS — S46912A Strain of unspecified muscle, fascia and tendon at shoulder and upper arm level, left arm, initial encounter: Secondary | ICD-10-CM | POA: Diagnosis not present

## 2018-08-23 DIAGNOSIS — Z79899 Other long term (current) drug therapy: Secondary | ICD-10-CM | POA: Diagnosis not present

## 2018-08-23 DIAGNOSIS — Y9389 Activity, other specified: Secondary | ICD-10-CM | POA: Insufficient documentation

## 2018-08-23 DIAGNOSIS — Y999 Unspecified external cause status: Secondary | ICD-10-CM | POA: Insufficient documentation

## 2018-08-23 DIAGNOSIS — S4991XA Unspecified injury of right shoulder and upper arm, initial encounter: Secondary | ICD-10-CM | POA: Diagnosis present

## 2018-08-23 DIAGNOSIS — M5412 Radiculopathy, cervical region: Secondary | ICD-10-CM | POA: Diagnosis not present

## 2018-08-23 MED ORDER — CYCLOBENZAPRINE HCL 10 MG PO TABS: 10.0000 mg | ORAL_TABLET | Freq: Two times a day (BID) | ORAL | 0 refills | Status: AC | PRN

## 2018-08-23 MED ORDER — IBUPROFEN 800 MG PO TABS: 800.0000 mg | ORAL_TABLET | Freq: Three times a day (TID) | ORAL | 0 refills | Status: AC | PRN

## 2018-08-23 MED ORDER — IBUPROFEN 800 MG PO TABS
800.0000 mg | ORAL_TABLET | Freq: Once | ORAL | Status: AC
  Administered 2018-08-23: 800 mg via ORAL
  Filled 2018-08-23: qty 1
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
## 2019-05-29 ENCOUNTER — Encounter (HOSPITAL_BASED_OUTPATIENT_CLINIC_OR_DEPARTMENT_OTHER): Payer: Self-pay | Admitting: Emergency Medicine

## 2019-05-29 ENCOUNTER — Other Ambulatory Visit: Payer: Self-pay

## 2019-05-29 ENCOUNTER — Emergency Department (HOSPITAL_BASED_OUTPATIENT_CLINIC_OR_DEPARTMENT_OTHER): Payer: BC Managed Care – PPO

## 2019-05-29 ENCOUNTER — Emergency Department (HOSPITAL_BASED_OUTPATIENT_CLINIC_OR_DEPARTMENT_OTHER)
Admission: EM | Admit: 2019-05-29 | Discharge: 2019-05-29 | Disposition: A | Discharge status: Home / Self Care | Payer: BC Managed Care – PPO | Attending: Emergency Medicine | Admitting: Emergency Medicine

## 2019-05-29 DIAGNOSIS — F17200 Nicotine dependence, unspecified, uncomplicated: Secondary | ICD-10-CM | POA: Diagnosis not present

## 2019-05-29 DIAGNOSIS — R6883 Chills (without fever): Secondary | ICD-10-CM | POA: Insufficient documentation

## 2019-05-29 DIAGNOSIS — R519 Headache, unspecified: Secondary | ICD-10-CM | POA: Diagnosis present

## 2019-05-29 DIAGNOSIS — E876 Hypokalemia: Secondary | ICD-10-CM | POA: Insufficient documentation

## 2019-05-29 DIAGNOSIS — Z20822 Contact with and (suspected) exposure to covid-19: Secondary | ICD-10-CM

## 2019-05-29 DIAGNOSIS — Z79899 Other long term (current) drug therapy: Secondary | ICD-10-CM | POA: Diagnosis not present

## 2019-05-29 DIAGNOSIS — Z20828 Contact with and (suspected) exposure to other viral communicable diseases: Secondary | ICD-10-CM | POA: Insufficient documentation

## 2019-05-29 LAB — CBC WITH DIFFERENTIAL/PLATELET
Abs Immature Granulocytes: 0.02 10*3/uL (ref 0.00–0.07)
Basophils Absolute: 0 10*3/uL (ref 0.0–0.1)
Basophils Relative: 1 %
Eosinophils Absolute: 0.1 10*3/uL (ref 0.0–0.5)
Eosinophils Relative: 2 %
HCT: 42.4 % (ref 36.0–46.0)
Hemoglobin: 13.4 g/dL (ref 12.0–15.0)
Immature Granulocytes: 1 %
Lymphocytes Relative: 27 %
Lymphs Abs: 0.8 10*3/uL (ref 0.7–4.0)
MCH: 30.9 pg (ref 26.0–34.0)
MCHC: 31.6 g/dL (ref 30.0–36.0)
MCV: 97.7 fL (ref 80.0–100.0)
Monocytes Absolute: 0.4 10*3/uL (ref 0.1–1.0)
Monocytes Relative: 13 %
Neutro Abs: 1.7 10*3/uL (ref 1.7–7.7)
Neutrophils Relative %: 56 %
Platelets: 172 10*3/uL (ref 150–400)
RBC: 4.34 MIL/uL (ref 3.87–5.11)
RDW: 13.4 % (ref 11.5–15.5)
WBC: 2.9 10*3/uL — ABNORMAL LOW (ref 4.0–10.5)
nRBC: 0 % (ref 0.0–0.2)

## 2019-05-29 LAB — URINALYSIS, ROUTINE W REFLEX MICROSCOPIC
Bilirubin Urine: NEGATIVE
Glucose, UA: NEGATIVE mg/dL
Ketones, ur: NEGATIVE mg/dL
Leukocytes,Ua: NEGATIVE
Nitrite: NEGATIVE
Protein, ur: NEGATIVE mg/dL
Specific Gravity, Urine: 1.02 (ref 1.005–1.030)
pH: 7 (ref 5.0–8.0)

## 2019-05-29 LAB — BASIC METABOLIC PANEL
Anion gap: 8 (ref 5–15)
BUN: 10 mg/dL (ref 6–20)
CO2: 24 mmol/L (ref 22–32)
Calcium: 8.7 mg/dL — ABNORMAL LOW (ref 8.9–10.3)
Chloride: 105 mmol/L (ref 98–111)
Creatinine, Ser: 0.92 mg/dL (ref 0.44–1.00)
GFR calc Af Amer: >60 mL/min (ref >60)
GFR calc non Af Amer: >60 mL/min (ref >60)
Glucose, Bld: 100 mg/dL — ABNORMAL HIGH (ref 70–99)
Potassium: 3.2 mmol/L — ABNORMAL LOW (ref 3.5–5.1)
Sodium: 137 mmol/L (ref 135–145)

## 2019-05-29 LAB — URINALYSIS, MICROSCOPIC (REFLEX)

## 2019-05-29 LAB — PREGNANCY, URINE: Preg Test, Ur: NEGATIVE

## 2019-05-29 MED ORDER — DIPHENHYDRAMINE HCL 50 MG/ML IJ SOLN
25.0000 mg | Freq: Once | INTRAMUSCULAR | Status: AC
  Administered 2019-05-29: 25 mg via INTRAVENOUS
  Filled 2019-05-29: qty 1

## 2019-05-29 MED ORDER — POTASSIUM CHLORIDE CRYS ER 20 MEQ PO TBCR
40.0000 meq | EXTENDED_RELEASE_TABLET | Freq: Once | ORAL | Status: AC
  Administered 2019-05-29: 40 meq via ORAL
  Filled 2019-05-29: qty 2

## 2019-05-29 MED ORDER — SODIUM CHLORIDE 0.9 % IV SOLN
Freq: Once | INTRAVENOUS | Status: AC
  Administered 2019-05-29: 22:00:00 via INTRAVENOUS

## 2019-05-29 MED ORDER — ONDANSETRON HCL 4 MG/2ML IJ SOLN
4.0000 mg | Freq: Once | INTRAMUSCULAR | Status: AC
  Administered 2019-05-29: 4 mg via INTRAVENOUS
  Filled 2019-05-29: qty 2

## 2019-05-29 MED ORDER — METOCLOPRAMIDE HCL 5 MG/ML IJ SOLN
10.0000 mg | Freq: Once | INTRAMUSCULAR | Status: AC
  Administered 2019-05-29: 10 mg via INTRAVENOUS
  Filled 2019-05-29: qty 2

## 2019-05-29 NOTE — ED Triage Notes (Signed)
Pt states today she is having chills, headache, body aches, palpitations, and chest tightness

## 2019-05-29 NOTE — Discharge Instructions (Signed)
Please follow-up with primary care doctor.  Your potassium was mildly low today.  May be causing your body cramps.  Suspect that you have a viral illness causing your headache, chills, fatigue and body aches.  Please use Tyle ibuprofen as described below you may use lower doses if pain is under control.  Please use Tylenol or ibuprofen for pain.  You may use 600 mg ibuprofen every 6 hours or 1000 mg of Tylenol every 6 hours.  You may choose to alternate between the 2.  This would be most effective.  Not to exceed 4 g of Tylenol within 24 hours.  Not to exceed 3200 mg ibuprofen 24 hours.

## 2019-05-29 NOTE — ED Provider Notes (Signed)
[facility] EMERGENCY DEPARTMENT Provider Note   CSN: [id number] Arrival date & time: 05/29/19  2010     History   Chief Complaint Chief Complaint  Patient presents with  . Headache  . Chills    HPI Emma Ali is a 33 y.o. female.     HPI  Patient is 33 year old female presented today for headache that began at 4 PM today.  Associated symptoms include nausea, chills, heart palpitations, shortness of breath.  Patient states that approximately 4 PM today she realized she had not eaten any food at that point and began feeling nauseated and had chills.  Patient states that she then developed a frontal headache as bandlike with some neck tension.  Patient states she eat pizza, wings, drank soda and lay down.  States that she felt her headache improve somewhat but began having palpitations and shortness of breath.  Patient states that she now longer has any chest tightness or shortness of breath but continues to have bandlike headache.  Denies any nausea.  Patient denies any history of medical problems other than hypothyroidism which she was told she no longer needed medication for.  Patient states that she last took levothyroxine approximately a month ago.  Denies any cold or heat intolerance.  Denies any weight changes.  Patient denies any history of clots, hemoptysis, hormone use, recent trauma or surgery denies cough or hemoptysis, she is without any leg swelling or tenderness.  Patient states she had 3 vaccinations 11/20 is concerned that she is having reaction to this.   Past Medical History:  Diagnosis Date  . Thyroid disease     There are no active problems to display for this patient.   Past Surgical History:  Procedure Laterality Date  . BREAST REDUCTION SURGERY       OB History   No obstetric history on file.      Home Medications    Prior to Admission medications   Medication Sig Start Date End Date Taking? Authorizing Provider   acetaminophen (TYLENOL) 500 MG tablet Take 1 tablet (500 mg total) by mouth every 6 (six) hours as needed. 04/18/18   Law, Waylan Boga, PA-C  benzonatate (TESSALON) 100 MG capsule Take 1 capsule (100 mg total) by mouth every 8 (eight) hours. 04/18/18   Law, Waylan Boga, PA-C  cetirizine-pseudoephedrine (ZYRTEC-D) 5-120 MG tablet Take 1 tablet by mouth 2 (two) times daily. 04/18/18   Law, Waylan Boga, PA-C  cyclobenzaprine (FLEXERIL) 10 MG tablet Take 1 tablet (10 mg total) by mouth 2 (two) times daily as needed for muscle spasms. 08/23/18   Tilden Fossa, MD  fluticasone [facility]) 50 MCG/ACT nasal spray Place 1 spray into both nostrils daily. 04/18/18   Law, Waylan Boga, PA-C  ibuprofen (ADVIL,MOTRIN) 800 MG tablet Take 1 tablet (800 mg total) by mouth every 8 (eight) hours as needed. 08/23/18   Tilden Fossa, MD  levothyroxine (SYNTHROID, LEVOTHROID) 75 MCG tablet Take 75 mcg by mouth daily before breakfast.    [provider]  levothyroxine (SYNTHROID, LEVOTHROID) 75 MCG tablet Take 75 mcg by mouth daily before breakfast.    [provider]  ondansetron (ZOFRAN) 4 MG tablet Take 1 tablet (4 mg total) by mouth every 6 (six) hours. 05/15/15   Patel-Mills, Lorelle Formosa, PA-C    Family History Family History  Problem Relation Age of Onset  . Cancer Maternal Grandmother     Social History Social History   Tobacco Use  . Smoking status: Light Tobacco Smoker  .  Smokeless tobacco: Never Used  Substance Use Topics  . Alcohol use: Yes    Comment: occasional  . Drug use: No     Allergies   Amoxicillin and Penicillins   Review of Systems Review of Systems  Constitutional: Positive for chills. Negative for fever.  HENT: Negative for congestion.   Respiratory: Positive for chest tightness and shortness of breath. Negative for cough.   Cardiovascular: Positive for palpitations. Negative for chest pain and leg swelling.  Gastrointestinal: Positive for nausea. Negative for  abdominal pain, diarrhea and vomiting.  Musculoskeletal: Negative for back pain.  Skin: Negative for rash.  Neurological: Negative for dizziness and numbness.     Physical Exam Updated Vital Signs BP 126/89   Pulse 90   Temp 99.3 F (37.4 C) (Oral)   Resp 18   Ht 5\' 4"  (1.626 m)   Wt 90.7 kg   LMP 05/24/2019 (Exact Date)   SpO2 99%   BMI 34.33 kg/m   Physical Exam Vitals signs and nursing note reviewed.  Constitutional:      General: She is not in acute distress.    Appearance: She is not ill-appearing.     Comments: Well-appearing 33 year old female pleasant appears stated age.  Able to follow commands answers questions appropriately.  Does not appear to be in distress.  HENT:     Head: Normocephalic and atraumatic.     Nose: Nose normal.     Mouth/Throat:     Mouth: Mucous membranes are moist.     Pharynx: No oropharyngeal exudate or posterior oropharyngeal erythema.  Eyes:     General: No scleral icterus. Neck:     Musculoskeletal: Normal range of motion and neck supple. No neck rigidity or muscular tenderness.  Cardiovascular:     Rate and Rhythm: Normal rate and regular rhythm.     Pulses: Normal pulses.     Heart sounds: Normal heart sounds.     Comments: Heart rate is 90 during my evaluation Pulmonary:     Effort: Pulmonary effort is normal. No respiratory distress.     Breath sounds: No wheezing.  Abdominal:     General: There is no distension.     Palpations: Abdomen is soft.     Tenderness: There is no abdominal tenderness. There is no guarding.  Musculoskeletal:     Right lower leg: No edema.     Left lower leg: No edema.     Comments: No calf tenderness.  No anterior chest wall tenderness to palpation.  Lymphadenopathy:     Cervical: No cervical adenopathy.  Skin:    General: Skin is warm and dry.     Capillary Refill: Capillary refill takes less than 2 seconds.  Neurological:     Mental Status: She is alert. Mental status is at baseline.   Psychiatric:        Mood and Affect: Mood normal.        Behavior: Behavior normal.      ED Treatments / Results  Labs (all labs ordered are listed, but only abnormal results are displayed) Labs Reviewed  URINALYSIS, ROUTINE W REFLEX MICROSCOPIC - Abnormal; Notable for the following components:      Result Value   Hgb urine dipstick SMALL (*)    All other components within normal limits  BASIC METABOLIC PANEL - Abnormal; Notable for the following components:   Potassium 3.2 (*)    Glucose, Bld 100 (*)    Calcium 8.7 (*)    All other components  within normal limits  CBC WITH DIFFERENTIAL/PLATELET - Abnormal; Notable for the following components:   WBC 2.9 (*)    All other components within normal limits  URINALYSIS, MICROSCOPIC (REFLEX) - Abnormal; Notable for the following components:   Bacteria, UA FEW (*)    All other components within normal limits  SARS CORONAVIRUS 2 (TAT 6-24 HRS)  PREGNANCY, URINE  TSH  T4, FREE    EKG EKG Interpretation  Date/Time:  Tuesday May 29 2019 20:20:36 EST Ventricular Rate:  101 PR Interval:  192 QRS Duration: 96 QT Interval:  332 QTC Calculation: 430 R Axis:   81 Text Interpretation: Sinus tachycardia Nonspecific ST and T wave abnormality Abnormal ECG No prior ECG for comparuson. No STEMI Confirmed by Theda Belfast ([id number]) on 05/29/2019 8:24:21 PM   Radiology Dg Chest Portable 1 View  Result Date: 05/29/2019 CLINICAL DATA:  33 year old female with chest pain and chills. EXAM: PORTABLE CHEST 1 VIEW COMPARISON:  Chest radiograph dated 08/23/2018. FINDINGS: The heart size and mediastinal contours are within normal limits. Both lungs are clear. The visualized skeletal structures are unremarkable. IMPRESSION: No active disease. Electronically Signed   By: Elgie Collard M.D.   On: 05/29/2019 21:47    Procedures Procedures (including critical care time)  Medications Ordered in ED Medications  0.9 %  sodium chloride infusion (  Intravenous New Bag/Given 05/29/19 2146)  ondansetron (ZOFRAN) injection 4 mg (4 mg Intravenous Given 05/29/19 2147)  metoCLOPramide (REGLAN) injection 10 mg (10 mg Intravenous Given 05/29/19 2147)  diphenhydrAMINE (BENADRYL) injection 25 mg (25 mg Intravenous Given 05/29/19 2147)  potassium chloride SA (KLOR-CON) CR tablet 40 mEq (40 mEq Oral Given 05/29/19 2239)     Initial Impression / Assessment and Plan / ED Course  I have reviewed the triage vital signs and the nursing notes.  Pertinent labs & imaging results that were available during my care of the patient were reviewed by me and considered in my medical decision making (see chart for details).         Patient presents for headache, chills, transient nausea, chest tightness and shortness of breath with palpitations.  Patient has any history of similar.  States that she had   Concern for electrolyte abnormalities and her chills, weakness, body aches.  Also her description of of a cramp in her leg earlier is consistent with this.  Doubt this constellation of symptoms are related to her recent vaccination.  On reassessment at 10:30 PM patient appears much improved.  States that headache has resolved.  States she only feels nauseous.  Denies any chest tightness at this time.  States that she had 1 additional episode of palpitations while in ED however pulse was approximately 94 at this time.   Found to be mildly hypokalemic this may be reason for her intermittent leg cramps.  Given 40 mEq potassium.  Blood work otherwise unremarkable other than WBC 2.9 this may be due to COVID-19 infection which I suspect.  UA without evidence of infection as there are only few bacteria and evidence of contamination.  Covid test pending.  X-ray within normal limits.  EKG ischemia.  Patient given Zofran, Reglan, Benadryl, 1 L NS.  Vitals are within normal limits.  Patient is low risk by [place].  PERC negative as on my assessment she was not tachycardic.   Low concern for pneumonia.  Doubt pericarditis or myocarditis.  Doubt ACS.  Suspect the patient has viral illness causing her symptoms.  Doubt this is related to recent  vaccinations.  Will recommend patient isolate in case this is Covid infection.   Will discharge with strict return precautions.     Final Clinical Impressions(s) / ED Diagnoses   Final diagnoses:  Chills  Bad headache  Suspected COVID-19 virus infection    ED Discharge Orders    None       Gailen ShelterFondaw, Brookelynne Dimperio S, [place] 05/29/19 2319    Tegeler, Canary Brimhristopher J, MD 05/30/19 [phone]

## 2019-05-30 LAB — T4, FREE: Free T4: 0.76 ng/dL (ref 0.61–1.12)

## 2019-05-30 LAB — SARS CORONAVIRUS 2 (TAT 6-24 HRS): SARS Coronavirus 2: NEGATIVE

## 2019-05-30 LAB — TSH: TSH: 2.398 u[IU]/mL (ref 0.350–4.500)
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
